# Patient Record
Sex: Female | Born: 1990 | Hispanic: Yes | Marital: Single | Smoking: Never smoker
Health system: Southern US, Community
[De-identification: ages and names within clinical notes are randomized; demographics above are authoritative.]

## PROBLEM LIST (undated history)

## (undated) ENCOUNTER — Inpatient Hospital Stay (HOSPITAL_COMMUNITY): Payer: Self-pay

## (undated) DIAGNOSIS — Q249 Congenital malformation of heart, unspecified: Secondary | ICD-10-CM

## (undated) DIAGNOSIS — K219 Gastro-esophageal reflux disease without esophagitis: Secondary | ICD-10-CM

## (undated) DIAGNOSIS — Z3490 Encounter for supervision of normal pregnancy, unspecified, unspecified trimester: Secondary | ICD-10-CM

## (undated) DIAGNOSIS — F329 Major depressive disorder, single episode, unspecified: Secondary | ICD-10-CM

## (undated) DIAGNOSIS — F32A Depression, unspecified: Secondary | ICD-10-CM

## (undated) HISTORY — DX: Congenital malformation of heart, unspecified: Q24.9

---

## 2014-06-25 LAB — OB RESULTS CONSOLE VARICELLA ZOSTER ANTIBODY, IGG: VARICELLA IGG: IMMUNE

## 2014-06-25 LAB — OB RESULTS CONSOLE RPR: RPR: NONREACTIVE

## 2014-06-25 LAB — OB RESULTS CONSOLE ABO/RH: RH TYPE: POSITIVE

## 2014-06-25 LAB — OB RESULTS CONSOLE HEPATITIS B SURFACE ANTIGEN: Hepatitis B Surface Ag: NEGATIVE

## 2014-06-25 LAB — OB RESULTS CONSOLE HGB/HCT, BLOOD: Hemoglobin: 12.5 g/dL

## 2014-06-25 LAB — OB RESULTS CONSOLE RUBELLA ANTIBODY, IGM: RUBELLA: IMMUNE

## 2014-06-25 LAB — OB RESULTS CONSOLE ANTIBODY SCREEN: ANTIBODY SCREEN: NEGATIVE

## 2014-06-25 LAB — SICKLE CELL SCREEN: Sickle Cell Screen: NORMAL

## 2014-06-25 LAB — CULTURE, OB URINE: Urine Culture, OB: NO GROWTH

## 2014-08-10 ENCOUNTER — Inpatient Hospital Stay (HOSPITAL_COMMUNITY)
Admission: AD | Admit: 2014-08-10 | Discharge: 2014-08-11 | Disposition: A | Discharge status: Home / Self Care | Payer: Medicaid Other | Source: Ambulatory Visit | Attending: Family Medicine | Admitting: Family Medicine

## 2014-08-10 ENCOUNTER — Encounter (HOSPITAL_COMMUNITY): Payer: Self-pay | Admitting: *Deleted

## 2014-08-10 DIAGNOSIS — O99612 Diseases of the digestive system complicating pregnancy, second trimester: Secondary | ICD-10-CM | POA: Diagnosis not present

## 2014-08-10 DIAGNOSIS — K219 Gastro-esophageal reflux disease without esophagitis: Secondary | ICD-10-CM | POA: Insufficient documentation

## 2014-08-10 DIAGNOSIS — Z3A17 17 weeks gestation of pregnancy: Secondary | ICD-10-CM | POA: Insufficient documentation

## 2014-08-10 DIAGNOSIS — R1010 Upper abdominal pain, unspecified: Secondary | ICD-10-CM | POA: Insufficient documentation

## 2014-08-10 DIAGNOSIS — IMO0001 Reserved for inherently not codable concepts without codable children: Secondary | ICD-10-CM

## 2014-08-10 LAB — CBC
HCT: 35.6 % — ABNORMAL LOW (ref 36.0–46.0)
Hemoglobin: 11.9 g/dL — ABNORMAL LOW (ref 12.0–15.0)
MCH: 29.8 pg (ref 26.0–34.0)
MCHC: 33.4 g/dL (ref 30.0–36.0)
MCV: 89.2 fL (ref 78.0–100.0)
Platelets: 307 10*3/uL (ref 150–400)
RBC: 3.99 MIL/uL (ref 3.87–5.11)
RDW: 13.1 % (ref 11.5–15.5)
WBC: 16.4 10*3/uL — AB (ref 4.0–10.5)

## 2014-08-10 MED ORDER — PROMETHAZINE HCL 25 MG PO TABS
25.0000 mg | ORAL_TABLET | Freq: Once | ORAL | Status: AC
  Administered 2014-08-10: 25 mg via ORAL
  Filled 2014-08-10: qty 1

## 2014-08-10 MED ORDER — FAMOTIDINE 20 MG PO TABS
20.0000 mg | ORAL_TABLET | Freq: Once | ORAL | Status: AC
  Administered 2014-08-10: 20 mg via ORAL
  Filled 2014-08-10: qty 1

## 2014-08-10 MED ORDER — GI COCKTAIL ~~LOC~~
30.0000 mL | Freq: Once | ORAL | Status: AC
  Administered 2014-08-10: 30 mL via ORAL
  Filled 2014-08-10: qty 30

## 2014-08-10 NOTE — MAU Note (Signed)
Pt reports upper abd pain that started 3 hours ago, states she vomited a lot when the pain first started. States she has had decreased appetite for the last 3 days. Also reports a headache all day today. Also reports mid upper back pain.

## 2014-08-10 NOTE — MAU Provider Note (Signed)
History     CSN: [id number]  Arrival date and time: 08/10/14 2148   First Provider Initiated Contact with Patient 08/10/14 2324      No chief complaint on file.  HPI Comments: Amanda Mahoney is a 24 y.o. [phone] at [redacted]w[redacted]d who presents today with upper abdominal pain. She states that the pain is worse after eating. She denies any vaginal bleeding or LOF. She states that she has felt the fetus move.   Abdominal Pain This is a new problem. The current episode started today. The onset quality is gradual. The problem occurs constantly. The problem has been unchanged. The pain is located in the epigastric region. The pain is at a severity of 7/10. The quality of the pain is burning. The abdominal pain does not radiate. Associated symptoms include nausea. Pertinent negatives include no constipation, diarrhea, dysuria, fever, frequency or vomiting. The pain is aggravated by eating. The pain is relieved by nothing. She has tried nothing for the symptoms.     History reviewed. No pertinent past medical history.  History reviewed. No pertinent past surgical history.  Family History  Problem Relation Age of Onset  . Hypertension Mother   . Hypertension Sister   . Hypertension Maternal Grandmother     History  Substance Use Topics  . Smoking status: Never Smoker   . Smokeless tobacco: Not on file  . Alcohol Use: No    Allergies:  Allergies  Allergen Reactions  . Latex Rash    No prescriptions prior to admission    Review of Systems  Constitutional: Negative for fever.  Gastrointestinal: Positive for nausea and abdominal pain. Negative for vomiting, diarrhea and constipation.  Genitourinary: Negative for dysuria, urgency and frequency.   Physical Exam   Blood pressure 118/62, pulse 114, temperature 98.8 F (37.1 C), resp. rate 18, height  (1.499 m), weight 106.595 kg (235 lb), SpO2 98 %.  Physical Exam  Nursing note and vitals reviewed. Constitutional: She is oriented  to person, place, and time. She appears well-developed and well-nourished. No distress.  HENT:  Head: Normocephalic.  Cardiovascular: Normal rate.   Respiratory: Effort normal.  GI: Soft. There is no tenderness. There is no rebound.  Neurological: She is alert and oriented to person, place, and time.  Skin: Skin is warm and dry.  Psychiatric: She has a normal mood and affect.   Results for orders placed or performed during the hospital encounter of 08/10/14 (from the past 24 hour(s))  CBC     Status: Abnormal   Collection Time: 08/10/14 11:31 PM  Result Value Ref Range   WBC 16.4 (H) 4.0 - 10.5 K/uL   RBC 3.99 3.87 - 5.11 MIL/uL   Hemoglobin 11.9 (L) 12.0 - 15.0 g/dL   HCT 64.4 (L) 03.4 - 74.2 %   MCV 89.2 78.0 - 100.0 fL   MCH 29.8 26.0 - 34.0 pg   MCHC 33.4 30.0 - 36.0 g/dL   RDW 59.5 63.8 - 75.6 %   Platelets 307 150 - 400 K/uL  Comprehensive metabolic panel     Status: Abnormal   Collection Time: 08/10/14 11:52 PM  Result Value Ref Range   Sodium 138 135 - 145 mmol/L   Potassium 3.6 3.5 - 5.1 mmol/L   Chloride 104 101 - 111 mmol/L   CO2 25 22 - 32 mmol/L   Glucose, Bld 99 65 - 99 mg/dL   BUN 7 6 - 20 mg/dL   Creatinine, Ser 4.33 0.44 - 1.00 mg/dL  Calcium 8.9 8.9 - 10.3 mg/dL   Total Protein 6.6 6.5 - 8.1 g/dL   Albumin 3.1 (L) 3.5 - 5.0 g/dL   AST 15 15 - 41 U/L   ALT 14 14 - 54 U/L   Alkaline Phosphatase 62 38 - 126 U/L   Total Bilirubin 0.1 (L) 0.3 - 1.2 mg/dL   GFR calc non Af Amer >60 >60 mL/min   GFR calc Af Amer >60 >60 mL/min   Anion gap 9 5 - 15  Amylase     Status: None   Collection Time: 08/10/14 11:52 PM  Result Value Ref Range   Amylase 80 28 - 100 U/L  Lipase, blood     Status: Abnormal   Collection Time: 08/10/14 11:52 PM  Result Value Ref Range   Lipase 16 (L) 22 - 51 U/L  Urinalysis, Routine w reflex microscopic (not at [facility])     Status: Abnormal   Collection Time: 08/11/14 12:08 AM  Result Value Ref Range   Color, Urine YELLOW YELLOW    APPearance CLEAR CLEAR   Specific Gravity, Urine >1.030 (H) 1.005 - 1.030   pH 5.5 5.0 - 8.0   Glucose, UA NEGATIVE NEGATIVE mg/dL   Hgb urine dipstick NEGATIVE NEGATIVE   Bilirubin Urine NEGATIVE NEGATIVE   Ketones, ur NEGATIVE NEGATIVE mg/dL   Protein, ur NEGATIVE NEGATIVE mg/dL   Urobilinogen, UA 0.2 0.0 - 1.0 mg/dL   Nitrite NEGATIVE NEGATIVE   Leukocytes, UA NEGATIVE NEGATIVE    MAU Course  Procedures  MDM Patient reports that pain has improved with pepcid and phenergan   Assessment and Plan   1. Reflux    DC home Comfort measures reviewed  2ndTrimester precautions  RX: protonix 20 mg BID #60  Return to MAU as needed FU with OB as planned  Follow-up Information    Follow up with [facility].   Specialty:  Obstetrics and Gynecology   Why:  they will call you with an appointment    Contact information:   [address] [place] [address] [phone]        Tawnya Crook 08/10/2014, 11:25 PM

## 2014-08-11 DIAGNOSIS — K219 Gastro-esophageal reflux disease without esophagitis: Secondary | ICD-10-CM | POA: Diagnosis not present

## 2014-08-11 LAB — COMPREHENSIVE METABOLIC PANEL
ALT: 14 U/L (ref 14–54)
ANION GAP: 9 (ref 5–15)
AST: 15 U/L (ref 15–41)
Albumin: 3.1 g/dL — ABNORMAL LOW (ref 3.5–5.0)
Alkaline Phosphatase: 62 U/L (ref 38–126)
BUN: 7 mg/dL (ref 6–20)
CHLORIDE: 104 mmol/L (ref 101–111)
CO2: 25 mmol/L (ref 22–32)
Calcium: 8.9 mg/dL (ref 8.9–10.3)
Creatinine, Ser: 0.51 mg/dL (ref 0.44–1.00)
GFR calc Af Amer: >60 mL/min (ref >60)
GFR calc non Af Amer: >60 mL/min (ref >60)
Glucose, Bld: 99 mg/dL (ref 65–99)
POTASSIUM: 3.6 mmol/L (ref 3.5–5.1)
Sodium: 138 mmol/L (ref 135–145)
TOTAL PROTEIN: 6.6 g/dL (ref 6.5–8.1)
Total Bilirubin: 0.1 mg/dL — ABNORMAL LOW (ref 0.3–1.2)

## 2014-08-11 LAB — URINALYSIS, ROUTINE W REFLEX MICROSCOPIC
BILIRUBIN URINE: NEGATIVE
Glucose, UA: NEGATIVE mg/dL
HGB URINE DIPSTICK: NEGATIVE
Ketones, ur: NEGATIVE mg/dL
LEUKOCYTES UA: NEGATIVE
Nitrite: NEGATIVE
Protein, ur: NEGATIVE mg/dL
Specific Gravity, Urine: >1.03 — ABNORMAL HIGH (ref 1.005–1.030)
Urobilinogen, UA: 0.2 mg/dL (ref 0.0–1.0)
pH: 5.5 (ref 5.0–8.0)

## 2014-08-11 LAB — LIPASE, BLOOD: LIPASE: 16 U/L — AB (ref 22–51)

## 2014-08-11 LAB — AMYLASE: Amylase: 80 U/L (ref 28–100)

## 2014-08-11 MED ORDER — ACETAMINOPHEN 500 MG PO TABS
1000.0000 mg | ORAL_TABLET | Freq: Once | ORAL | Status: AC
  Administered 2014-08-11: 1000 mg via ORAL
  Filled 2014-08-11: qty 2

## 2014-08-11 MED ORDER — PANTOPRAZOLE SODIUM 20 MG PO TBEC: 20.0000 mg | DELAYED_RELEASE_TABLET | Freq: Two times a day (BID) | ORAL | Status: DC

## 2014-08-11 NOTE — Discharge Instructions (Signed)

## 2014-08-12 ENCOUNTER — Ambulatory Visit (INDEPENDENT_AMBULATORY_CARE_PROVIDER_SITE_OTHER): Payer: Medicaid Other | Admitting: Advanced Practice Midwife

## 2014-08-12 ENCOUNTER — Encounter: Payer: Self-pay | Admitting: Advanced Practice Midwife

## 2014-08-12 VITALS — BP 114/59 | HR 101 | Temp 98.7°F | Wt 231.4 lb

## 2014-08-12 DIAGNOSIS — O0932 Supervision of pregnancy with insufficient antenatal care, second trimester: Secondary | ICD-10-CM | POA: Diagnosis not present

## 2014-08-12 LAB — POCT URINALYSIS DIP (DEVICE)
BILIRUBIN URINE: NEGATIVE
Glucose, UA: NEGATIVE mg/dL
HGB URINE DIPSTICK: NEGATIVE
KETONES UR: NEGATIVE mg/dL
Nitrite: NEGATIVE
PROTEIN: NEGATIVE mg/dL
SPECIFIC GRAVITY, URINE: 1.02 (ref 1.005–1.030)
Urobilinogen, UA: 1 mg/dL (ref 0.0–1.0)
pH: 6.5 (ref 5.0–8.0)

## 2014-08-12 NOTE — Patient Instructions (Signed)
Second Trimester of Pregnancy The second trimester is from week 13 through week 28, months 4 through 6. The second trimester is often a time when you feel your best. Your body has also adjusted to being pregnant, and you begin to feel better physically. Usually, morning sickness has lessened or quit completely, you may have more energy, and you may have an increase in appetite. The second trimester is also a time when the fetus is growing rapidly. At the end of the sixth month, the fetus is about 9 inches long and weighs about 1 pounds. You will likely begin to feel the baby move (quickening) between 18 and 20 weeks of the pregnancy. BODY CHANGES Your body goes through many changes during pregnancy. The changes vary from woman to woman.   Your weight will continue to increase. You will notice your lower abdomen bulging out.  You may begin to get stretch marks on your hips, abdomen, and breasts.  You may develop headaches that can be relieved by medicines approved by your health care provider.  You may urinate more often because the fetus is pressing on your bladder.  You may develop or continue to have heartburn as a result of your pregnancy.  You may develop constipation because certain hormones are causing the muscles that push waste through your intestines to slow down.  You may develop hemorrhoids or swollen, bulging veins (varicose veins).  You may have back pain because of the weight gain and pregnancy hormones relaxing your joints between the bones in your pelvis and as a result of a shift in weight and the muscles that support your balance.  Your breasts will continue to grow and be tender.  Your gums may bleed and may be sensitive to brushing and flossing.  Dark spots or blotches (chloasma, mask of pregnancy) may develop on your face. This will likely fade after the baby is born.  A dark line from your belly button to the pubic area (linea nigra) may appear. This will likely fade  after the baby is born.  You may have changes in your hair. These can include thickening of your hair, rapid growth, and changes in texture. Some women also have hair loss during or after pregnancy, or hair that feels dry or thin. Your hair will most likely return to normal after your baby is born. WHAT TO EXPECT AT YOUR PRENATAL VISITS During a routine prenatal visit:  You will be weighed to make sure you and the fetus are growing normally.  Your blood pressure will be taken.  Your abdomen will be measured to track your baby's growth.  The fetal heartbeat will be listened to.  Any test results from the previous visit will be discussed. Your health care provider may ask you:  How you are feeling.  If you are feeling the baby move.  If you have had any abnormal symptoms, such as leaking fluid, bleeding, severe headaches, or abdominal cramping.  If you have any questions. Other tests that may be performed during your second trimester include:  Blood tests that check for:  Low iron levels (anemia).  Gestational diabetes (between 24 and 28 weeks).  Rh antibodies.  Urine tests to check for infections, diabetes, or protein in the urine.  An ultrasound to confirm the proper growth and development of the baby.  An amniocentesis to check for possible genetic problems.  Fetal screens for spina bifida and Down syndrome. HOME CARE INSTRUCTIONS   Avoid all smoking, herbs, alcohol, and unprescribed   drugs. These chemicals affect the formation and growth of the baby.  Follow your health care provider's instructions regarding medicine use. There are medicines that are either safe or unsafe to take during pregnancy.  Exercise only as directed by your health care provider. Experiencing uterine cramps is a good sign to stop exercising.  Continue to eat regular, healthy meals.  Wear a good support bra for breast tenderness.  Do not use hot tubs, steam rooms, or saunas.  Wear your  seat belt at all times when driving.  Avoid raw meat, uncooked cheese, cat litter boxes, and soil used by cats. These carry germs that can cause birth defects in the baby.  Take your prenatal vitamins.  Try taking a stool softener (if your health care provider approves) if you develop constipation. Eat more high-fiber foods, such as fresh vegetables or fruit and whole grains. Drink plenty of fluids to keep your urine clear or pale yellow.  Take warm sitz baths to soothe any pain or discomfort caused by hemorrhoids. Use hemorrhoid cream if your health care provider approves.  If you develop varicose veins, wear support hose. Elevate your feet for 15 minutes, 3-4 times a day. Limit salt in your diet.  Avoid heavy lifting, wear low heel shoes, and practice good posture.  Rest with your legs elevated if you have leg cramps or low back pain.  Visit your dentist if you have not gone yet during your pregnancy. Use a soft toothbrush to brush your teeth and be gentle when you floss.  A sexual relationship may be continued unless your health care provider directs you otherwise.  Continue to go to all your prenatal visits as directed by your health care provider. SEEK MEDICAL CARE IF:   You have dizziness.  You have mild pelvic cramps, pelvic pressure, or nagging pain in the abdominal area.  You have persistent nausea, vomiting, or diarrhea.  You have a bad smelling vaginal discharge.  You have pain with urination. SEEK IMMEDIATE MEDICAL CARE IF:   You have a fever.  You are leaking fluid from your vagina.  You have spotting or bleeding from your vagina.  You have severe abdominal cramping or pain.  You have rapid weight gain or loss.  You have shortness of breath with chest pain.  You notice sudden or extreme swelling of your face, hands, ankles, feet, or legs.  You have not felt your baby move in over an hour.  You have severe headaches that do not go away with  medicine.  You have vision changes. Document Released: 12/13/2000 Document Revised: 12/24/2012 Document Reviewed: 02/20/2012 ExitCare Patient Information 2015 [employer]. This information is not intended to replace advice given to you by your health care provider. Make sure you discuss any questions you have with your health care provider.  

## 2014-08-12 NOTE — Progress Notes (Signed)
Here for first visit. States had one ob visit at [facility] Department.  Given prenatal education booklets today. C/o pain in upper abdomen at times, came to MAU 08/10/14 for that pain-states pain is worse after eating.

## 2014-08-12 NOTE — Progress Notes (Signed)
   Subjective:    Amanda Mahoney is a [id number] [redacted]w[redacted]d being seen today for her first obstetrical visit.  She was seen this week in the MAU for upper abdominal pain and treated with Protonix.    Her obstetrical history is significant for obesity. Patient does intend to breast feed. Pregnancy history fully reviewed.  Patient reports heartburn, nausea, vomiting and clear discharge from left nipple.  Filed Vitals:   08/12/14 0827  BP: 114/59  Pulse: 101  Temp: 98.7 F (37.1 C)  Weight: 231 lb 6.4 oz (104.962 kg)    HISTORY: OB History  Gravida Para Term Preterm AB SAB TAB Ectopic Multiple Living  0 2 2 0 0 0 2    # Outcome Date GA Lbr Len/2nd Weight Sex Delivery Anes PTL Lv  4 Current           3 SAB 12/2013 [redacted]w[redacted]d         2 SAB 01/2013 [redacted]w[redacted]d         1 Term 03/23/09 [redacted]w[redacted]d  6 lb (2.722 kg) M Vag-Spont None  Y     Comments: no complications, born in [place]     Past Medical History  Diagnosis Date  . 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 Family History  Problem Relation Age of Onset  . Hypertension Mother   . Hypertension Sister   . Hypertension Maternal Grandmother      Exam    Uterus:   not palpated  Pelvic Exam: Deferred  System: Breast:  normal appearance, no masses or tenderness, clear discharge seen on left nipple   Skin: normal coloration and turgor, no rashes    Neurologic: oriented, normal mood, no focal deficits   Extremities: normal strength, tone, and muscle mass   HEENT PERRLA, extra ocular movement intact, oropharynx clear, no lesions and neck supple with midline trachea   Mouth/Teeth mucous membranes moist, pharynx normal without lesions   Neck supple   Cardiovascular: regular rate and rhythm   Respiratory:  appears well, vitals normal, no respiratory distress, acyanotic, normal RR, ear and throat exam is normal, neck free of mass or lymphadenopathy, chest clear, no wheezing,  crepitations, rhonchi, normal symmetric air entry   Abdomen: soft, non-tender; bowel sounds normal; no masses,  no organomegaly   Urinary: bladder not palpable      Assessment:    Pregnancy: [name] There are no active problems to display for this patient.       Plan:     Initial labs drawn. Prenatal vitamins; pt is having difficulty tolerating the vitamins given to her, she will begin chewable Flinstones instead Problem list reviewed and updated. Genetic Screening discussed Quad Screen: ordered.  Ultrasound discussed; fetal survey: ordered.  Follow up in 4 weeks. 50% of 30 min visit spent on counseling and coordination of care.  Patient can use Zantac instead of Protonix for heartburn until she can get her medicaid  She is staying at the [facility] Records have been requested Amanda Mahoney 08/12/2014

## 2014-08-13 LAB — AFP, QUAD SCREEN
AFP: 18.2 ng/mL
Curr Gest Age: 17.4 wks.days
Down Syndrome Scr Risk Est: 1:3290 {titer}
HCG, Total: 14.79 IU/mL
INH: 93.4 pg/mL
INTERPRETATION-AFP: NEGATIVE
MOM FOR INH: 0.85
MoM for AFP: 0.62
MoM for hCG: 0.77
Open Spina bifida: NEGATIVE
Osb Risk: <1:27300 {titer}
Tri 18 Scr Risk Est: NEGATIVE
uE3 Mom: 1.32
uE3 Value: 1.53 ng/mL

## 2014-08-13 LAB — GLUCOSE TOLERANCE, 1 HOUR (50G) W/O FASTING: GLUCOSE 1 HOUR GTT: 108 mg/dL (ref 70–140)

## 2014-08-17 ENCOUNTER — Encounter (HOSPITAL_COMMUNITY): Payer: Self-pay | Admitting: *Deleted

## 2014-08-17 ENCOUNTER — Inpatient Hospital Stay (HOSPITAL_COMMUNITY): Payer: Medicaid Other

## 2014-08-17 ENCOUNTER — Inpatient Hospital Stay (HOSPITAL_COMMUNITY)
Admission: AD | Admit: 2014-08-17 | Discharge: 2014-08-17 | Disposition: A | Discharge status: Home / Self Care | Payer: Medicaid Other | Source: Ambulatory Visit | Attending: Obstetrics & Gynecology | Admitting: Obstetrics & Gynecology

## 2014-08-17 ENCOUNTER — Telehealth: Payer: Self-pay | Admitting: *Deleted

## 2014-08-17 DIAGNOSIS — Z3A18 18 weeks gestation of pregnancy: Secondary | ICD-10-CM | POA: Diagnosis not present

## 2014-08-17 DIAGNOSIS — O26612 Liver and biliary tract disorders in pregnancy, second trimester: Secondary | ICD-10-CM

## 2014-08-17 DIAGNOSIS — O99612 Diseases of the digestive system complicating pregnancy, second trimester: Secondary | ICD-10-CM | POA: Insufficient documentation

## 2014-08-17 DIAGNOSIS — R1010 Upper abdominal pain, unspecified: Secondary | ICD-10-CM | POA: Diagnosis present

## 2014-08-17 DIAGNOSIS — K8020 Calculus of gallbladder without cholecystitis without obstruction: Secondary | ICD-10-CM | POA: Diagnosis not present

## 2014-08-17 LAB — COMPREHENSIVE METABOLIC PANEL
ALBUMIN: 3.1 g/dL — AB (ref 3.5–5.0)
ALT: 19 U/L (ref 14–54)
ANION GAP: 8 (ref 5–15)
AST: 16 U/L (ref 15–41)
Alkaline Phosphatase: 65 U/L (ref 38–126)
BUN: 5 mg/dL — ABNORMAL LOW (ref 6–20)
CHLORIDE: 104 mmol/L (ref 101–111)
CO2: 25 mmol/L (ref 22–32)
CREATININE: 0.5 mg/dL (ref 0.44–1.00)
Calcium: 8.7 mg/dL — ABNORMAL LOW (ref 8.9–10.3)
GFR calc non Af Amer: >60 mL/min (ref >60)
Glucose, Bld: 94 mg/dL (ref 65–99)
Potassium: 4.1 mmol/L (ref 3.5–5.1)
SODIUM: 137 mmol/L (ref 135–145)
Total Bilirubin: 0.2 mg/dL — ABNORMAL LOW (ref 0.3–1.2)
Total Protein: 6.8 g/dL (ref 6.5–8.1)

## 2014-08-17 LAB — URINALYSIS, ROUTINE W REFLEX MICROSCOPIC
BILIRUBIN URINE: NEGATIVE
Glucose, UA: NEGATIVE mg/dL
Ketones, ur: NEGATIVE mg/dL
NITRITE: NEGATIVE
Protein, ur: NEGATIVE mg/dL
Urobilinogen, UA: 0.2 mg/dL (ref 0.0–1.0)
pH: 7 (ref 5.0–8.0)

## 2014-08-17 LAB — CBC
HCT: 36.7 % (ref 36.0–46.0)
HEMOGLOBIN: 12.1 g/dL (ref 12.0–15.0)
MCH: 29.7 pg (ref 26.0–34.0)
MCHC: 33 g/dL (ref 30.0–36.0)
MCV: 90 fL (ref 78.0–100.0)
PLATELETS: 312 10*3/uL (ref 150–400)
RBC: 4.08 MIL/uL (ref 3.87–5.11)
RDW: 13.1 % (ref 11.5–15.5)
WBC: 12.4 10*3/uL — ABNORMAL HIGH (ref 4.0–10.5)

## 2014-08-17 LAB — URINE MICROSCOPIC-ADD ON

## 2014-08-17 MED ORDER — GI COCKTAIL ~~LOC~~
30.0000 mL | Freq: Once | ORAL | Status: DC
Start: 2014-08-17 — End: 2014-08-17

## 2014-08-17 MED ORDER — ACETAMINOPHEN 500 MG PO TABS: 1000.0000 mg | ORAL_TABLET | Freq: Three times a day (TID) | ORAL | Status: DC | PRN

## 2014-08-17 MED ORDER — TRAMADOL HCL 50 MG PO TABS: 50.0000 mg | ORAL_TABLET | Freq: Four times a day (QID) | ORAL | Status: DC | PRN

## 2014-08-17 MED ORDER — FAMOTIDINE 20 MG PO TABS
40.0000 mg | ORAL_TABLET | Freq: Once | ORAL | Status: AC
  Administered 2014-08-17: 40 mg via ORAL
  Filled 2014-08-17: qty 2

## 2014-08-17 NOTE — Telephone Encounter (Signed)
Amanda Mahoney called Friday 08/14/14  Am and left a message in Spanish .  Called today with interpreter Nile Riggs and patient reports she does not need anything now because she has already gone to MAU and got it taken care of. She denies needing anything at this time.

## 2014-08-17 NOTE — MAU Note (Signed)
Pt presents to MAU with complaints of reflux. States she was evaluated a week ago and given medications for reflux and it isn't working. Reports abdominal pain since she ate supper last night. Denies any vaginal bleeding or abnormal discharge

## 2014-08-17 NOTE — MAU Provider Note (Signed)
History     CSN: [id number]  Arrival date and time: 08/17/14 0903   First Provider Initiated Contact with Patient 08/17/14 [phone]      Chief Complaint  Patient presents with  . Abdominal Pain   HPI   [name] is a 24 y.o. female [phone] at 22w2dpresenting with a 2 week history of upper abdominal pain. She presented to MAU on 8/8 with similar complaints and was diagnosed with GERD and was sent home with an RX for protonix in which she has been taking as prescribed. She had labs done that day and told they were all normal.  She feels like her stomach is full all the time; she cannot eat a full meal. The pain worsens after a meal and this is why she has been afraid to eat.  She tried eating a dinner roll last night and just felt too full to eat more.  She tried drinking apple juice this morning and vomited that up.  She has occasional heartburn, this discomfort is in the upper part of her abdomen; both sides, right below her breast.   She also has had problems with constipation. She is not using the bathroom on a regular basis, although her normal BM schedule is 2-3 times per day.   OB History    Gravida Para Term Preterm AB TAB SAB Ectopic Multiple Living   4 1 1  0 2 0 2 0 0 1      Past Medical History  Diagnosis Date  . 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 No pertinent past surgical history.  Family History  Problem Relation Age of Onset  . Hypertension Mother   . Hypertension Sister   . Hypertension Maternal Grandmother     Social History  Substance Use Topics  . Smoking status: Never Smoker   . Smokeless tobacco: Never Used  . Alcohol Use: No    Allergies:  Allergies  Allergen Reactions  . Latex Rash    Prescriptions prior to admission  Medication Sig Dispense Refill Last Dose  . pantoprazole (PROTONIX) 20 MG tablet Take 1 tablet (20 mg total) by mouth 2 (two) times daily. 60 tablet 1 08/16/2014 at Unknown time    Results for orders placed or performed during the hospital encounter of 08/17/14 (from the past 48 hour(s))  [facility]inal[facility], [facility]     Status: Abnormal   Collection Time: 08/17/14  9:00 AM  Result Value Ref Range   Color, Urine YELLOW YELLOW   APPearance HAZY (A) CLEAR   Specific Gravity, Urine <1.005 (L) 1.005 - 1.030   pH 7.0 5.0 - 8.0   Glucose, UA NEGATIVE NEGATIVE mg/dL   Hgb urine dipstick TRACE (A) NEGATIVE   Bilirubin Urine NEGATIVE NEGATIVE   Ketones, ur NEGATIVE NEGATIVE mg/dL   Protein, ur NEGATIVE NEGATIVE mg/dL   Urobilinogen, UA 0.2 0.0 - 1.0 mg/dL   Nitrite NEGATIVE NEGATIVE   Leukocytes, UA MODERATE (A) NEGATIVE  Urine microscopic-add on     Status: Abnormal   Collection Time: 08/17/14  9:00 AM  Result Value Ref Range   Squamous Epithelial / LPF MANY (A) RARE   WBC, UA 3-6 <3 WBC/hpf   Bacteria, UA FEW (A) RARE  CBC     Status: Abnormal   Collection Time: 08/17/14 11:42 AM  Result Value Ref Range   WBC 12.4 (H) 4.0 - 10.5 K/uL   RBC  4.08 3.87 - 5.11 MIL/uL   Hemoglobin 12.1 12.0 - 15.0 g/dL   HCT 36.7 36.0 - 46.0 %   MCV 90.0 78.0 - 100.0 fL   MCH 29.7 26.0 - 34.0 pg   MCHC 33.0 30.0 - 36.0 g/dL   RDW 13.1 11.5 - 15.5 %   Platelets 312 150 - 400 K/uL  Comprehensive metabolic panel     Status: Abnormal   Collection Time: 08/17/14 11:42 AM  Result Value Ref Range   Sodium 137 135 - 145 mmol/L   Potassium 4.1 3.5 - 5.1 mmol/L   Chloride 104 101 - 111 mmol/L   CO2 25 22 - 32 mmol/L   Glucose, Bld 94 65 - 99 mg/dL   BUN 5 (L) 6 - 20 mg/dL   Creatinine, Ser 0.50 0.44 - 1.00 mg/dL   Calcium 8.7 (L) 8.9 - 10.3 mg/dL   Total Protein 6.8 6.5 - 8.1 g/dL   Albumin 3.1 (L) 3.5 - 5.0 g/dL   AST 16 15 - 41 U/L   ALT 19 14 - 54 U/L   Alkaline Phosphatase 65 38 - 126 U/L   Total Bilirubin 0.2 (L) 0.3 - 1.2 mg/dL   GFR calc non Af Amer >60 >60 mL/min   GFR calc Af Amer >60 >60 mL/min    Comment: (NOTE) The eGFR has been  calculated using the CKD EPI equation. This calculation has not been validated in all clinical situations. eGFR's persistently <60 mL/min signify possible Chronic Kidney Disease.    Anion gap 8 5 - 15    US Abdomen Limited Ruq  08/17/2014   CLINICAL DATA:  Upper abdominal pain.  Second trimester gestation  EXAM: US ABDOMEN LIMITED - RIGHT UPPER QUADRANT  COMPARISON:  None.  FINDINGS: Gallbladder:  Within the gallbladder, there are echogenic foci which move and shadow consistent with gallstones. The largest gallstone measures 1.4 cm in length. There is no gallbladder wall thickening or pericholecystic fluid. No sonographic Murphy sign noted.  Common bile duct:  Diameter: 2 mm. There is no intrahepatic or extrahepatic biliary duct dilatation.  Liver:  No focal lesion identified. Within normal limits in parenchymal echogenicity.  IMPRESSION: Cholelithiasis.  Study otherwise unremarkable.   Electronically Signed   By: Lowella Grip III M.D.   On: 08/17/2014 12:20    Review of Systems  Constitutional: Negative for fever and chills.  Cardiovascular: Negative for chest pain.  Gastrointestinal: Positive for heartburn, nausea, vomiting and constipation.  Genitourinary: Negative for dysuria, urgency, frequency and flank pain.  Musculoskeletal: Negative for back pain.  Neurological: Positive for headaches.   Physical Exam   Blood pressure 123/69, pulse 98, temperature 98.2 F (36.8 C), resp. rate 18.  Physical Exam  Constitutional: She appears well-developed and well-nourished. No distress.  HENT:  Head: Normocephalic.  Eyes: Pupils are equal, round, and reactive to light.  GI: Normal appearance and bowel sounds are normal. She exhibits no distension. There is no splenomegaly or hepatomegaly. There is tenderness in the right upper quadrant, epigastric area, periumbilical area and left upper quadrant. There is rigidity and positive Murphy's sign. There is no rebound and no guarding.  Skin: She  is not diaphoretic.    MAU Course  Procedures  None  MDM  + fetal heart tones.  Pepcid 40 mg PO Discussed [place] with Dr. Harolyn Rutherford  No relief after Pepcid, patient sitting in the bed stating that she feels like something is pulling and tightening in her upper abdomen;both sides, just below her  breasts.   Upper abdominal US ordered.   Assessment and Plan   A:  1. Cholelithiasis affecting pregnancy in second trimester, antepartum   2. Upper abdominal pain    P:  Discharge home in stable condition Return to MAU if symptoms worsen  RX: Ultram Follow up with [place] [facility] on Friday at 1:40 pm for appointment; referral made.  Diet discussed, avoid high fat, fried foods.     Lezlie Lye, NP 08/17/2014 3:00 PM

## 2014-08-21 ENCOUNTER — Encounter: Payer: Self-pay | Admitting: Advanced Practice Midwife

## 2014-08-23 LAB — BARBITURATES (GC/LC/MS), URINE
Amobarbital: NEGATIVE ng/mL (ref <100)
Butalbital: NEGATIVE ng/mL (ref <100)
PENTABARBITAL GC/MS, URINE: NEGATIVE ng/mL (ref <100)
Phenobarbital: 266 ng/mL — AB (ref <100)
Secobarbital: NEGATIVE ng/mL (ref <100)

## 2014-08-24 LAB — PRESCRIPTION MONITORING PROFILE (19 PANEL)
AMPHETAMINE/METH: NEGATIVE ng/mL
Benzodiazepine Screen, Urine: NEGATIVE ng/mL
Buprenorphine, Urine: NEGATIVE ng/mL
CANNABINOID SCRN UR: NEGATIVE ng/mL
CARISOPRODOL, URINE: NEGATIVE ng/mL
Cocaine Metabolites: NEGATIVE ng/mL
Creatinine, Urine: 126.42 mg/dL (ref >20.0)
Fentanyl, Ur: NEGATIVE ng/mL
MDMA URINE: NEGATIVE ng/mL
METHADONE SCREEN, URINE: NEGATIVE ng/mL
Meperidine, Ur: NEGATIVE ng/mL
Methaqualone: NEGATIVE ng/mL
Nitrites, Initial: NEGATIVE ug/mL
OPIATE SCREEN, URINE: NEGATIVE ng/mL
OXYCODONE SCRN UR: NEGATIVE ng/mL
PHENCYCLIDINE, UR: NEGATIVE ng/mL
PROPOXYPHENE: NEGATIVE ng/mL
TAPENTADOLUR: NEGATIVE ng/mL
Tramadol Scrn, Ur: NEGATIVE ng/mL
Zolpidem, Urine: NEGATIVE ng/mL
pH, Initial: 7.1 pH (ref 4.5–8.9)

## 2014-08-26 ENCOUNTER — Ambulatory Visit (HOSPITAL_COMMUNITY)
Admission: RE | Admit: 2014-08-26 | Discharge: 2014-08-26 | Disposition: A | Discharge status: Home / Self Care | Payer: Medicaid Other | Source: Ambulatory Visit | Attending: Advanced Practice Midwife | Admitting: Advanced Practice Midwife

## 2014-08-26 ENCOUNTER — Other Ambulatory Visit: Payer: Self-pay | Admitting: Advanced Practice Midwife

## 2014-08-26 DIAGNOSIS — O0932 Supervision of pregnancy with insufficient antenatal care, second trimester: Secondary | ICD-10-CM

## 2014-08-26 DIAGNOSIS — O99212 Obesity complicating pregnancy, second trimester: Secondary | ICD-10-CM

## 2014-08-26 DIAGNOSIS — Z3689 Encounter for other specified antenatal screening: Secondary | ICD-10-CM

## 2014-08-26 DIAGNOSIS — K8020 Calculus of gallbladder without cholecystitis without obstruction: Secondary | ICD-10-CM

## 2014-08-26 DIAGNOSIS — O26619 Liver and biliary tract disorders in pregnancy, unspecified trimester: Secondary | ICD-10-CM

## 2014-08-26 DIAGNOSIS — Z36 Encounter for antenatal screening of mother: Secondary | ICD-10-CM | POA: Insufficient documentation

## 2014-08-26 DIAGNOSIS — Z3A19 19 weeks gestation of pregnancy: Secondary | ICD-10-CM

## 2014-08-26 DIAGNOSIS — O26612 Liver and biliary tract disorders in pregnancy, second trimester: Secondary | ICD-10-CM | POA: Insufficient documentation

## 2014-08-26 DIAGNOSIS — E669 Obesity, unspecified: Secondary | ICD-10-CM | POA: Insufficient documentation

## 2014-08-27 ENCOUNTER — Ambulatory Visit: Payer: Self-pay | Admitting: General Surgery

## 2014-08-31 ENCOUNTER — Encounter: Payer: Self-pay | Admitting: Advanced Practice Midwife

## 2014-08-31 DIAGNOSIS — F1390 Sedative, hypnotic, or anxiolytic use, unspecified, uncomplicated: Secondary | ICD-10-CM | POA: Insufficient documentation

## 2014-08-31 DIAGNOSIS — F1310 Sedative, hypnotic or anxiolytic abuse, uncomplicated: Secondary | ICD-10-CM

## 2014-08-31 NOTE — Addendum Note (Signed)
Encounter addended by: Aviva Signs, CNM on: 08/31/2014  6:43 PM<BR>     Documentation filed: Problem List

## 2014-09-09 ENCOUNTER — Ambulatory Visit (INDEPENDENT_AMBULATORY_CARE_PROVIDER_SITE_OTHER): Payer: Medicaid Other | Admitting: Obstetrics and Gynecology

## 2014-09-09 VITALS — BP 94/65 | HR 101 | Temp 98.3°F | Wt 233.7 lb

## 2014-09-09 DIAGNOSIS — Z23 Encounter for immunization: Secondary | ICD-10-CM | POA: Diagnosis not present

## 2014-09-09 DIAGNOSIS — O0932 Supervision of pregnancy with insufficient antenatal care, second trimester: Secondary | ICD-10-CM

## 2014-09-09 DIAGNOSIS — K811 Chronic cholecystitis: Secondary | ICD-10-CM | POA: Insufficient documentation

## 2014-09-09 LAB — POCT URINALYSIS DIP (DEVICE)
Bilirubin Urine: NEGATIVE
GLUCOSE, UA: NEGATIVE mg/dL
Hgb urine dipstick: NEGATIVE
KETONES UR: NEGATIVE mg/dL
NITRITE: NEGATIVE
PROTEIN: NEGATIVE mg/dL
Specific Gravity, Urine: 1.015 (ref 1.005–1.030)
UROBILINOGEN UA: 1 mg/dL (ref 0.0–1.0)
pH: 7 (ref 5.0–8.0)

## 2014-09-09 MED ORDER — TETANUS-DIPHTH-ACELL PERTUSSIS 5-2.5-18.5 LF-MCG/0.5 IM SUSP: 0.5000 mL | Freq: Once | INTRAMUSCULAR | Status: DC

## 2014-09-09 NOTE — Progress Notes (Signed)
Edema- feet/ legs    Pain/pressure- lower abd and pressure when gets out of bed and standing  Educated pt on Feeding on Demand and info given  Medical Instruction Form filled out and given to pt

## 2014-09-09 NOTE — Addendum Note (Signed)
Addended by: Faythe Casa on: 09/09/2014 09:36 AM   Modules accepted: Orders

## 2014-09-09 NOTE — Progress Notes (Signed)
Subjective:  Azaleah Usman is a 24 y.o. [phone] at [redacted]w[redacted]d being seen today for ongoing prenatal care.  Patient reports no complaints.  Contractions: Not present.  Vag. Bleeding: None. Movement: Present. Denies leaking of fluid.   The following portions of the patient's history were reviewed and updated as appropriate: allergies, current medications, past family history, past medical history, past social history, past surgical history and problem list.   Objective:   Filed Vitals:   09/09/14 0853  BP: 94/65  Pulse: 101  Temp: 98.3 F (36.8 C)  Weight: 233 lb 11.2 oz (106.006 kg)    Fetal Status: Fetal Heart Rate (bpm): 142   Movement: Present     General:  Alert, oriented and cooperative. Patient is in no acute distress.  Skin: Skin is warm and dry. No rash noted.   Cardiovascular: Normal heart rate noted  Respiratory: Normal respiratory effort, no problems with respiration noted  Abdomen: Soft, gravid, appropriate for gestational age. Pain/Pressure: Present     Pelvic: Vag. Bleeding: None     Cervical exam deferred        Extremities: Normal range of motion.  Edema: Trace  Mental Status: Normal mood and affect. Normal behavior. Normal judgment and thought content.   Urinalysis: Urine Protein: Negative Urine Glucose: Negative  Assessment and Plan:  Pregnancy: [id number] at [redacted]w[redacted]d  1. Late prenatal care affecting pregnancy in second trimester, antepartum - scheduling f/u growth scan today - flu vaccine today  Preterm labor symptoms and general obstetric precautions including but not limited to vaginal bleeding, contractions, leaking of fluid and fetal movement were reviewed in detail with the patient. Please refer to After Visit Summary for other counseling recommendations.  Return in about 4 weeks (around 10/07/2014).   Kathrynn Running, MD

## 2014-09-11 NOTE — Patient Instructions (Addendum)
YOUR PROCEDURE IS SCHEDULED ON :  09/15/14  REPORT TO [facility] MAIN ENTRANCE FOLLOW SIGNS TO EAST ELEVATOR - GO TO 3rd FLOOR CHECK IN AT 3 EAST NURSES STATION (SHORT STAY) AT:  10:15 AM  CALL THIS NUMBER IF YOU HAVE PROBLEMS THE MORNING OF SURGERY [phone]  REMEMBER:ONLY 1 PER PERSON MAY GO TO SHORT STAY WITH YOU TO GET READY THE MORNING OF YOUR SURGERY  DO NOT EAT FOOD OR DRINK LIQUIDS AFTER MIDNIGHT  TAKE THESE MEDICINES THE MORNING OF SURGERY: PROTONIX / TRAMADOL  YOU MAY NOT HAVE ANY METAL ON YOUR BODY INCLUDING HAIR PINS AND PIERCING'S. DO NOT WEAR JEWELRY, MAKEUP, LOTIONS, POWDERS OR PERFUMES. DO NOT WEAR NAIL POLISH. DO NOT SHAVE 48 HRS PRIOR TO SURGERY. MEN MAY SHAVE FACE AND NECK.  DO NOT BRING VALUABLES TO HOSPITAL. [facility] IS NOT RESPONSIBLE FOR VALUABLES.  CONTACTS, DENTURES OR PARTIALS MAY NOT BE WORN TO SURGERY. LEAVE SUITCASE IN CAR. CAN BE BROUGHT TO ROOM AFTER SURGERY.  PATIENTS DISCHARGED THE DAY OF SURGERY WILL NOT BE ALLOWED TO DRIVE HOME.  PLEASE READ OVER THE FOLLOWING INSTRUCTION SHEETS _________________________________________________________________________________                                          [facility] - PREPARING FOR SURGERY  Before surgery, you can play an important role.  Because skin is not sterile, your skin needs to be as free of germs as possible.  You can reduce the number of germs on your skin by washing with CHG (chlorahexidine gluconate) soap before surgery.  CHG is an antiseptic cleaner which kills germs and bonds with the skin to continue killing germs even after washing. Please DO NOT use if you have an allergy to CHG or antibacterial soaps.  If your skin becomes reddened/irritated stop using the CHG and inform your nurse when you arrive at Short Stay. Do not shave (including legs and underarms) for at least 48 hours prior to the first CHG shower.  You may shave your face. Please follow these  instructions carefully:   1.  Shower with CHG Soap the night before surgery and the  morning of Surgery.   2.  If you choose to wash your hair, wash your hair first as usual with your  normal  Shampoo.   3.  After you shampoo, rinse your hair and body thoroughly to remove the  shampoo.                                         4.  Use CHG as you would any other liquid soap.  You can apply chg directly  to the skin and wash . Gently wash with scrungie or clean wascloth    5.  Apply the CHG Soap to your body ONLY FROM THE NECK DOWN.   Do not use on open                           Wound or open sores. Avoid contact with eyes, ears mouth and genitals (private parts).                        Genitals (private parts) with your normal soap.  6.  Wash thoroughly, paying special attention to the area where your surgery  will be performed.   7.  Thoroughly rinse your body with warm water from the neck down.   8.  DO NOT shower/wash with your normal soap after using and rinsing off  the CHG Soap .                9.  Pat yourself dry with a clean towel.             10.  Wear clean night clothes to bed after shower             11.  Place clean sheets on your bed the night of your first shower and do not  sleep with pets.  Day of Surgery : Do not apply any lotions/deodorants the morning of surgery.  Please wear clean clothes to the hospital/surgery center.  FAILURE TO FOLLOW THESE INSTRUCTIONS MAY RESULT IN THE CANCELLATION OF YOUR SURGERY    PATIENT SIGNATURE_________________________________  ______________________________________________________________________

## 2014-09-14 ENCOUNTER — Encounter (HOSPITAL_COMMUNITY)
Admission: RE | Admit: 2014-09-14 | Discharge: 2014-09-14 | Disposition: A | Discharge status: Home / Self Care | Payer: Medicaid Other | Source: Ambulatory Visit | Attending: General Surgery | Admitting: General Surgery

## 2014-09-14 ENCOUNTER — Encounter (HOSPITAL_COMMUNITY): Payer: Self-pay

## 2014-09-14 DIAGNOSIS — Z01818 Encounter for other preprocedural examination: Secondary | ICD-10-CM | POA: Diagnosis present

## 2014-09-14 DIAGNOSIS — K819 Cholecystitis, unspecified: Secondary | ICD-10-CM | POA: Insufficient documentation

## 2014-09-14 HISTORY — DX: Depression, unspecified: F32.A

## 2014-09-14 HISTORY — DX: Encounter for supervision of normal pregnancy, unspecified, unspecified trimester: Z34.90

## 2014-09-14 HISTORY — DX: Major depressive disorder, single episode, unspecified: F32.9

## 2014-09-14 HISTORY — DX: Gastro-esophageal reflux disease without esophagitis: K21.9

## 2014-09-14 LAB — CBC
HEMATOCRIT: 35.9 % — AB (ref 36.0–46.0)
HEMOGLOBIN: 11.7 g/dL — AB (ref 12.0–15.0)
MCH: 28.9 pg (ref 26.0–34.0)
MCHC: 32.6 g/dL (ref 30.0–36.0)
MCV: 88.6 fL (ref 78.0–100.0)
PLATELETS: 331 10*3/uL (ref 150–400)
RBC: 4.05 MIL/uL (ref 3.87–5.11)
RDW: 12.9 % (ref 11.5–15.5)
WBC: 12.1 10*3/uL — ABNORMAL HIGH (ref 4.0–10.5)

## 2014-09-14 NOTE — Progress Notes (Signed)
Interpreter did not show for preop visit. Pt understands verbal English but has difficulty reading [place]. Hx obtained and verbal instructions given pt verbalized understanding. Consent to be signed on day of surgery with interpreter to translate written surgical consent. Interpreting Services notified.

## 2014-09-15 ENCOUNTER — Encounter (HOSPITAL_COMMUNITY): Payer: Self-pay | Admitting: *Deleted

## 2014-09-15 ENCOUNTER — Ambulatory Visit (HOSPITAL_COMMUNITY): Payer: Medicaid Other | Admitting: Certified Registered Nurse Anesthetist

## 2014-09-15 ENCOUNTER — Observation Stay (HOSPITAL_COMMUNITY)
Admission: RE | Admit: 2014-09-15 | Discharge: 2014-09-17 | Disposition: A | Discharge status: Home / Self Care | Payer: Medicaid Other | Source: Ambulatory Visit | Attending: General Surgery | Admitting: General Surgery

## 2014-09-15 ENCOUNTER — Encounter (HOSPITAL_COMMUNITY)
Admission: RE | Disposition: A | Discharge status: Home / Self Care | Payer: Self-pay | Source: Ambulatory Visit | Attending: General Surgery

## 2014-09-15 DIAGNOSIS — O26892 Other specified pregnancy related conditions, second trimester: Secondary | ICD-10-CM | POA: Insufficient documentation

## 2014-09-15 DIAGNOSIS — O99212 Obesity complicating pregnancy, second trimester: Secondary | ICD-10-CM | POA: Insufficient documentation

## 2014-09-15 DIAGNOSIS — O99342 Other mental disorders complicating pregnancy, second trimester: Secondary | ICD-10-CM | POA: Insufficient documentation

## 2014-09-15 DIAGNOSIS — Z3A22 22 weeks gestation of pregnancy: Secondary | ICD-10-CM | POA: Insufficient documentation

## 2014-09-15 DIAGNOSIS — Z91040 Latex allergy status: Secondary | ICD-10-CM | POA: Insufficient documentation

## 2014-09-15 DIAGNOSIS — Z6841 Body Mass Index (BMI) 40.0 and over, adult: Secondary | ICD-10-CM | POA: Insufficient documentation

## 2014-09-15 DIAGNOSIS — K219 Gastro-esophageal reflux disease without esophagitis: Secondary | ICD-10-CM | POA: Diagnosis not present

## 2014-09-15 DIAGNOSIS — R1011 Right upper quadrant pain: Secondary | ICD-10-CM | POA: Diagnosis present

## 2014-09-15 DIAGNOSIS — F329 Major depressive disorder, single episode, unspecified: Secondary | ICD-10-CM | POA: Insufficient documentation

## 2014-09-15 DIAGNOSIS — O99612 Diseases of the digestive system complicating pregnancy, second trimester: Secondary | ICD-10-CM | POA: Diagnosis not present

## 2014-09-15 DIAGNOSIS — K8012 Calculus of gallbladder with acute and chronic cholecystitis without obstruction: Secondary | ICD-10-CM | POA: Insufficient documentation

## 2014-09-15 DIAGNOSIS — K819 Cholecystitis, unspecified: Secondary | ICD-10-CM | POA: Diagnosis present

## 2014-09-15 HISTORY — PX: CHOLECYSTECTOMY: SHX55

## 2014-09-15 LAB — GLUCOSE, CAPILLARY: Glucose-Capillary: 144 mg/dL — ABNORMAL HIGH (ref 65–99)

## 2014-09-15 SURGERY — LAPAROSCOPIC CHOLECYSTECTOMY WITH INTRAOPERATIVE CHOLANGIOGRAM
Anesthesia: General | Site: Abdomen

## 2014-09-15 MED ORDER — MORPHINE SULFATE (PF) 2 MG/ML IV SOLN
2.0000 mg | INTRAVENOUS | Status: DC | PRN
  Administered 2014-09-15 – 2014-09-16 (×4): 2 mg via INTRAVENOUS
  Filled 2014-09-15 (×5): qty 1

## 2014-09-15 MED ORDER — ENOXAPARIN SODIUM 40 MG/0.4ML ~~LOC~~ SOLN
40.0000 mg | Freq: Once | SUBCUTANEOUS | Status: AC
  Administered 2014-09-15: 40 mg via SUBCUTANEOUS
  Filled 2014-09-15: qty 0.4

## 2014-09-15 MED ORDER — CEFAZOLIN SODIUM-DEXTROSE 2-3 GM-% IV SOLR
2.0000 g | INTRAVENOUS | Status: AC
  Administered 2014-09-15: 2 g via INTRAVENOUS

## 2014-09-15 MED ORDER — MIDAZOLAM HCL 2 MG/2ML IJ SOLN
INTRAMUSCULAR | Status: AC
  Filled 2014-09-15: qty 2

## 2014-09-15 MED ORDER — ONDANSETRON HCL 4 MG/2ML IJ SOLN
4.0000 mg | Freq: Four times a day (QID) | INTRAMUSCULAR | Status: DC | PRN
  Administered 2014-09-16: 4 mg via INTRAVENOUS
  Filled 2014-09-15: qty 2

## 2014-09-15 MED ORDER — HYDROMORPHONE HCL 1 MG/ML IJ SOLN
INTRAMUSCULAR | Status: AC
  Filled 2014-09-15: qty 1

## 2014-09-15 MED ORDER — FENTANYL CITRATE (PF) 100 MCG/2ML IJ SOLN
INTRAMUSCULAR | Status: DC | PRN
  Administered 2014-09-15: 25 ug via INTRAVENOUS
  Administered 2014-09-15: 50 ug via INTRAVENOUS
  Administered 2014-09-15: 25 ug via INTRAVENOUS
  Administered 2014-09-15: 100 ug via INTRAVENOUS
  Administered 2014-09-15: 50 ug via INTRAVENOUS

## 2014-09-15 MED ORDER — SUCCINYLCHOLINE CHLORIDE 20 MG/ML IJ SOLN
INTRAMUSCULAR | Status: DC | PRN
  Administered 2014-09-15: 100 mg via INTRAVENOUS

## 2014-09-15 MED ORDER — PROPOFOL 10 MG/ML IV BOLUS
INTRAVENOUS | Status: DC | PRN
  Administered 2014-09-15: 150 mg via INTRAVENOUS

## 2014-09-15 MED ORDER — MIDAZOLAM HCL 2 MG/2ML IJ SOLN
INTRAMUSCULAR | Status: AC
  Filled 2014-09-15: qty 4

## 2014-09-15 MED ORDER — 0.9 % SODIUM CHLORIDE (POUR BTL) OPTIME
TOPICAL | Status: DC | PRN
  Administered 2014-09-15: 1000 mL

## 2014-09-15 MED ORDER — EPHEDRINE SULFATE 50 MG/ML IJ SOLN
INTRAMUSCULAR | Status: AC
  Filled 2014-09-15: qty 1

## 2014-09-15 MED ORDER — LACTATED RINGERS IV SOLN
INTRAVENOUS | Status: DC
  Administered 2014-09-15 (×2): via INTRAVENOUS
  Administered 2014-09-15: 1000 mL via INTRAVENOUS

## 2014-09-15 MED ORDER — LIDOCAINE HCL (CARDIAC) 20 MG/ML IV SOLN
INTRAVENOUS | Status: AC
  Filled 2014-09-15: qty 5

## 2014-09-15 MED ORDER — HYDROMORPHONE HCL 1 MG/ML IJ SOLN
0.2500 mg | INTRAMUSCULAR | Status: DC | PRN
  Administered 2014-09-15 (×2): 0.5 mg via INTRAVENOUS

## 2014-09-15 MED ORDER — PHENYLEPHRINE 40 MCG/ML (10ML) SYRINGE FOR IV PUSH (FOR BLOOD PRESSURE SUPPORT)
PREFILLED_SYRINGE | INTRAVENOUS | Status: AC
  Filled 2014-09-15: qty 20

## 2014-09-15 MED ORDER — ROCURONIUM BROMIDE 100 MG/10ML IV SOLN
INTRAVENOUS | Status: DC | PRN
  Administered 2014-09-15 (×2): 20 mg via INTRAVENOUS

## 2014-09-15 MED ORDER — CEFAZOLIN SODIUM-DEXTROSE 2-3 GM-% IV SOLR
INTRAVENOUS | Status: AC
  Filled 2014-09-15: qty 50

## 2014-09-15 MED ORDER — PROPOFOL 10 MG/ML IV BOLUS
INTRAVENOUS | Status: AC
  Filled 2014-09-15: qty 20

## 2014-09-15 MED ORDER — HYDROCODONE-ACETAMINOPHEN 5-325 MG PO TABS
1.0000 | ORAL_TABLET | ORAL | Status: DC | PRN
  Administered 2014-09-16 (×2): 2 via ORAL
  Filled 2014-09-15 (×2): qty 2

## 2014-09-15 MED ORDER — GLYCOPYRROLATE 0.2 MG/ML IJ SOLN
INTRAMUSCULAR | Status: AC
  Filled 2014-09-15: qty 3

## 2014-09-15 MED ORDER — LIDOCAINE HCL (CARDIAC) 20 MG/ML IV SOLN
INTRAVENOUS | Status: DC | PRN
  Administered 2014-09-15: 50 mg via INTRAVENOUS

## 2014-09-15 MED ORDER — ONDANSETRON HCL 4 MG/2ML IJ SOLN
INTRAMUSCULAR | Status: DC | PRN
  Administered 2014-09-15: 4 mg via INTRAVENOUS

## 2014-09-15 MED ORDER — NEOSTIGMINE METHYLSULFATE 10 MG/10ML IV SOLN
INTRAVENOUS | Status: DC | PRN
  Administered 2014-09-15: 4 mg via INTRAVENOUS

## 2014-09-15 MED ORDER — PANTOPRAZOLE SODIUM 20 MG PO TBEC
20.0000 mg | DELAYED_RELEASE_TABLET | Freq: Two times a day (BID) | ORAL | Status: DC
  Administered 2014-09-16 – 2014-09-17 (×4): 20 mg via ORAL
  Filled 2014-09-15 (×5): qty 1

## 2014-09-15 MED ORDER — BUPIVACAINE-EPINEPHRINE 0.25% -1:200000 IJ SOLN
INTRAMUSCULAR | Status: DC | PRN
  Administered 2014-09-15: 30 mL

## 2014-09-15 MED ORDER — BUPIVACAINE-EPINEPHRINE (PF) 0.25% -1:200000 IJ SOLN
INTRAMUSCULAR | Status: AC
  Filled 2014-09-15: qty 30

## 2014-09-15 MED ORDER — DEXTROSE-NACL 5-0.45 % IV SOLN
INTRAVENOUS | Status: DC
  Administered 2014-09-16: 01:00:00 via INTRAVENOUS
  Administered 2014-09-16: 100 mL/h via INTRAVENOUS
  Administered 2014-09-16: 11:00:00 via INTRAVENOUS

## 2014-09-15 MED ORDER — NEOSTIGMINE METHYLSULFATE 10 MG/10ML IV SOLN
INTRAVENOUS | Status: AC
  Filled 2014-09-15: qty 1

## 2014-09-15 MED ORDER — PROMETHAZINE HCL 25 MG/ML IJ SOLN: 6.2500 mg | INTRAMUSCULAR | Status: DC | PRN

## 2014-09-15 MED ORDER — ROCURONIUM BROMIDE 100 MG/10ML IV SOLN
INTRAVENOUS | Status: AC
  Filled 2014-09-15: qty 1

## 2014-09-15 MED ORDER — FENTANYL CITRATE (PF) 250 MCG/5ML IJ SOLN
INTRAMUSCULAR | Status: AC
  Filled 2014-09-15: qty 5

## 2014-09-15 MED ORDER — ONDANSETRON HCL 4 MG/2ML IJ SOLN
INTRAMUSCULAR | Status: AC
  Filled 2014-09-15: qty 2

## 2014-09-15 MED ORDER — LACTATED RINGERS IR SOLN
Status: DC | PRN
  Administered 2014-09-15: 1000 mL

## 2014-09-15 MED ORDER — TRAMADOL HCL 50 MG PO TABS
50.0000 mg | ORAL_TABLET | Freq: Four times a day (QID) | ORAL | Status: DC | PRN
  Administered 2014-09-15 – 2014-09-16 (×2): 50 mg via ORAL
  Filled 2014-09-15 (×2): qty 1

## 2014-09-15 MED ORDER — PHENYLEPHRINE HCL 10 MG/ML IJ SOLN
INTRAMUSCULAR | Status: DC | PRN
  Administered 2014-09-15: 40 ug via INTRAVENOUS
  Administered 2014-09-15 (×7): 80 ug via INTRAVENOUS

## 2014-09-15 MED ORDER — GLYCOPYRROLATE 0.2 MG/ML IJ SOLN
INTRAMUSCULAR | Status: DC | PRN
  Administered 2014-09-15: .6 mg via INTRAVENOUS

## 2014-09-15 MED ORDER — ONDANSETRON 4 MG PO TBDP: 4.0000 mg | ORAL_TABLET | Freq: Four times a day (QID) | ORAL | Status: DC | PRN

## 2014-09-15 MED ORDER — FENTANYL CITRATE (PF) 250 MCG/5ML IJ SOLN
INTRAMUSCULAR | Status: AC
  Filled 2014-09-15: qty 25

## 2014-09-15 MED ORDER — CEFAZOLIN SODIUM-DEXTROSE 2-3 GM-% IV SOLR
2.0000 g | Freq: Three times a day (TID) | INTRAVENOUS | Status: AC
  Administered 2014-09-15: 2 g via INTRAVENOUS
  Filled 2014-09-15: qty 50

## 2014-09-15 MED ORDER — EPHEDRINE SULFATE 50 MG/ML IJ SOLN
INTRAMUSCULAR | Status: DC | PRN
  Administered 2014-09-15: 10 mg via INTRAVENOUS
  Administered 2014-09-15 (×2): 5 mg via INTRAVENOUS

## 2014-09-15 SURGICAL SUPPLY — 42 items
APPLIER CLIP 5 13 M/L LIGAMAX5 (MISCELLANEOUS)
APPLIER CLIP ROT 10 11.4 M/L (STAPLE) ×3
CABLE HIGH FREQUENCY MONO STRZ (ELECTRODE) ×3 IMPLANT
CATH CHOLANG 76X19 KUMAR (CATHETERS) IMPLANT
CHLORAPREP W/TINT 26ML (MISCELLANEOUS) ×3 IMPLANT
CLIP APPLIE 5 13 M/L LIGAMAX5 (MISCELLANEOUS) IMPLANT
CLIP APPLIE ROT 10 11.4 M/L (STAPLE) ×1 IMPLANT
COVER MAYO STAND STRL (DRAPES) ×3 IMPLANT
COVER SURGICAL LIGHT HANDLE (MISCELLANEOUS) IMPLANT
CUTTER FLEX LINEAR 45M (STAPLE) IMPLANT
DECANTER SPIKE VIAL GLASS SM (MISCELLANEOUS) ×3 IMPLANT
DRAIN CHANNEL 19F RND (DRAIN) IMPLANT
DRAPE C-ARM 42X120 X-RAY (DRAPES) ×3 IMPLANT
DRAPE LAPAROSCOPIC ABDOMINAL (DRAPES) ×3 IMPLANT
ELECT REM PT RETURN 9FT ADLT (ELECTROSURGICAL) ×3
ELECTRODE REM PT RTRN 9FT ADLT (ELECTROSURGICAL) ×1 IMPLANT
EVACUATOR SILICONE 100CC (DRAIN) IMPLANT
GLOVE BIOGEL PI IND STRL 7.0 (GLOVE) ×3 IMPLANT
GLOVE BIOGEL PI INDICATOR 7.0 (GLOVE) ×6
GLOVE SURG SS PI 7.0 STRL IVOR (GLOVE) ×3 IMPLANT
GOWN STRL REUS W/TWL LRG LVL3 (GOWN DISPOSABLE) ×3 IMPLANT
GOWN STRL REUS W/TWL XL LVL3 (GOWN DISPOSABLE) ×9 IMPLANT
KIT BASIN OR (CUSTOM PROCEDURE TRAY) ×3 IMPLANT
LIQUID BAND (GAUZE/BANDAGES/DRESSINGS) ×3 IMPLANT
PEN SKIN MARKING BROAD (MISCELLANEOUS) ×3 IMPLANT
POUCH RETRIEVAL ECOSAC 10 (ENDOMECHANICALS) ×1 IMPLANT
POUCH RETRIEVAL ECOSAC 10MM (ENDOMECHANICALS) ×2
RELOAD 45 VASCULAR/THIN (ENDOMECHANICALS) IMPLANT
RELOAD STAPLE TA45 3.5 REG BLU (ENDOMECHANICALS) IMPLANT
SCISSORS LAP 5X35 DISP (ENDOMECHANICALS) ×3 IMPLANT
SET IRRIG TUBING LAPAROSCOPIC (IRRIGATION / IRRIGATOR) ×3 IMPLANT
SHEARS HARMONIC ACE PLUS 36CM (ENDOMECHANICALS) IMPLANT
SLEEVE XCEL OPT CAN 5 100 (ENDOMECHANICALS) ×6 IMPLANT
STOPCOCK 4 WAY LG BORE MALE ST (IV SETS) IMPLANT
SUT ETHILON 2 0 PS N (SUTURE) IMPLANT
SUT MNCRL AB 4-0 PS2 18 (SUTURE) ×3 IMPLANT
SUT VICRYL 0 ENDOLOOP (SUTURE) IMPLANT
TOWEL OR 17X26 10 PK STRL BLUE (TOWEL DISPOSABLE) ×3 IMPLANT
TOWEL OR NON WOVEN STRL DISP B (DISPOSABLE) IMPLANT
TRAY LAPAROSCOPIC (CUSTOM PROCEDURE TRAY) ×3 IMPLANT
TROCAR BLADELESS OPT 5 100 (ENDOMECHANICALS) ×3 IMPLANT
TROCAR XCEL 12X100 BLDLESS (ENDOMECHANICALS) ×3 IMPLANT

## 2014-09-15 NOTE — H&P (Signed)
Amanda Mahoney is an 24 y.o. female.   Chief Complaint: abdominal pain HPI: Amanda Mahoney is a 24yo female now 22wga pregnant who has had severe abdominal pain for the last 2 months. She has been to the ER twice and is not eating well due to pain. Pain is in her RUQ and worse with meals. Work up revealed cholelithiasis with borderline wall thickening.  Past Medical History  Diagnosis Date  . 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 GERD (gastroesophageal reflux disease)   . Depression     11/2 YR AGO HOSPITALIZED FOR DEPRESSION IN [place]  . Currently pregnant     [redacted] WEEKS    History reviewed. No pertinent past surgical history.  Family History  Problem Relation Age of Onset  . Hypertension Mother   . Hypertension Sister   . Hypertension Maternal Grandmother    Social History:  reports that she has never smoked. She has never used smokeless tobacco. She reports that she does not drink alcohol or use illicit drugs.  Allergies:  Allergies  Allergen Reactions  . Latex Rash    Medications Prior to Admission  Medication Sig Dispense Refill  . acetaminophen (TYLENOL) 500 MG tablet Take 2 tablets (1,000 mg total) by mouth every 8 (eight) hours as needed. (Patient taking differently: Take 1,000 mg by mouth every 8 (eight) hours as needed for mild pain or headache. ) 30 tablet 0  . pantoprazole (PROTONIX) 20 MG tablet Take 1 tablet (20 mg total) by mouth 2 (two) times daily. 60 tablet 1  . traMADol (ULTRAM) 50 MG tablet Take 1 tablet (50 mg total) by mouth every 6 (six) hours as needed. (Patient taking differently: Take 50 mg by mouth every 6 (six) hours as needed for moderate pain. ) 20 tablet 0    Results for orders placed or performed during the hospital encounter of 09/14/14 (from the past 48 hour(s))  CBC     Status: Abnormal   Collection Time: 09/14/14  9:55 AM  Result Value Ref Range   WBC 12.1 (H) 4.0 - 10.5 K/uL   RBC 4.05 3.87 - 5.11 MIL/uL   Hemoglobin 11.7 (L)  12.0 - 15.0 g/dL   HCT 16.1 (L) 09.6 - 04.5 %   MCV 88.6 78.0 - 100.0 fL   MCH 28.9 26.0 - 34.0 pg   MCHC 32.6 30.0 - 36.0 g/dL   RDW 40.9 81.1 - 91.4 %   Platelets 331 150 - 400 K/uL   No results found.  Review of Systems  Constitutional: Negative for fever.  Respiratory: Negative for cough and hemoptysis.   Cardiovascular: Negative for chest pain.  Gastrointestinal: Positive for heartburn, nausea and abdominal pain.  Genitourinary: Negative for dysuria and urgency.  Musculoskeletal: Negative for myalgias and neck pain.  Skin: Negative for itching and rash.  Neurological: Negative for dizziness and tingling.  Endo/Heme/Allergies: Negative for environmental allergies. Does not bruise/bleed easily.  Psychiatric/Behavioral: Negative for depression and suicidal ideas.    Blood pressure 120/75, pulse 89, temperature 98.1 F (36.7 C), temperature source Oral, resp. rate 16, height 5' (1.524 m), weight 106.142 kg (234 lb), SpO2 100 %. Physical Exam  Constitutional: She is oriented to person, place, and time. She appears well-developed and well-nourished.  HENT:  Head: Normocephalic and atraumatic.  Neck: Normal range of motion. Neck supple.  Cardiovascular: Normal rate and regular rhythm.   Respiratory: Effort normal and breath sounds normal.  GI: Soft. There is tenderness.  Musculoskeletal: Normal range of motion. She exhibits no edema.  Neurological: She is alert and oriented to person, place, and time.  Skin: Skin is warm and dry.     Assessment/Plan 24 yo female 22wga with cholecystitis. She presents today for lap cholecystectomy -We discussed in depth the risk cholecystitis puts to her child and treating through the symptoms versus surgery -We discussed the risks of surgery including liver injury, common bile duct injury, loss of fetus, need to make larger incision, loose bowel movements, hernia. -She stated she understood the risks and decided to proceed. An interpreter was  present to help answer any questions, though Nayleen's understanding of English was good. -stay overnight for monitoring -FHT post op  De Blanch Kinsinger 09/15/2014, 11:24 AM

## 2014-09-15 NOTE — Anesthesia Procedure Notes (Signed)
Procedure Name: Intubation Date/Time: 09/15/2014 12:17 PM Performed by: Carolyne Fiscal, Skylah Delauter F Pre-anesthesia Checklist: Patient identified, Emergency Drugs available, Suction available, Patient being monitored and Timeout performed Patient Re-evaluated:Patient Re-evaluated prior to inductionOxygen Delivery Method: Circle system utilized Preoxygenation: Pre-oxygenation with 100% oxygen Intubation Type: IV induction Laryngoscope Size: Miller and 2 Grade View: Grade I Tube type: Oral Tube size: 7.0 mm Number of attempts: 1 Airway Equipment and Method: Stylet Placement Confirmation: ETT inserted through vocal cords under direct vision,  positive ETCO2 and breath sounds checked- equal and bilateral Secured at: 22 cm Tube secured with: Tape Dental Injury: Teeth and Oropharynx as per pre-operative assessment

## 2014-09-15 NOTE — Anesthesia Postprocedure Evaluation (Signed)
  Anesthesia Post-op Note  Patient: Amanda Mahoney  Procedure(s) Performed: Procedure(s): LAPAROSCOPIC CHOLECYSTECTOMY  (N/A)  Patient Location: PACU  Anesthesia Type:General  Level of Consciousness: awake and alert   Airway and Oxygen Therapy: Patient Spontanous Breathing  Post-op Pain: mild  Post-op Assessment: Post-op Vital signs reviewed              Post-op Vital Signs: stable  Last Vitals:  Filed Vitals:   09/15/14 1448  BP: 131/69  Pulse: 90  Temp: 37.2 C  Resp: 20    Complications: No apparent anesthesia complications

## 2014-09-15 NOTE — Progress Notes (Signed)
Spoke with Florentina Addison, RN with OB Rapid Response Team. Stated she spoke with Dr. Sheliah Hatch and she will arrive for fetal heart tones to [facility] once patient is out of surgery. Number placed on chart.

## 2014-09-15 NOTE — Anesthesia Preprocedure Evaluation (Addendum)
Anesthesia Evaluation  Patient identified by MRN, date of birth, ID band Patient awake  General Assessment Comment:[redacted] weeks pregnant  Reviewed: Allergy & Precautions, NPO status , Patient's Chart, lab work & pertinent test results  History of Anesthesia Complications Negative for: history of anesthetic complications  Airway Mallampati: I  TM Distance: >3 FB Neck ROM: Full    Dental  (+) Teeth Intact   Pulmonary    breath sounds clear to auscultation       Cardiovascular negative cardio ROS   Rhythm:Regular Rate:Normal     Neuro/Psych    GI/Hepatic Neg liver ROS, GERD  ,  Endo/Other  negative endocrine ROS  Renal/GU negative Renal ROS     Musculoskeletal negative musculoskeletal ROS (+)   Abdominal (+) + obese,   Peds  Hematology negative hematology ROS (+)   Anesthesia Other Findings   Reproductive/Obstetrics                            Anesthesia Physical Anesthesia Plan  ASA: II  Anesthesia Plan: General   Post-op Pain Management:    Induction: Intravenous  Airway Management Planned: Oral ETT  Additional Equipment:   Intra-op Plan:   Post-operative Plan: Extubation in OR  Informed Consent: I have reviewed the patients History and Physical, chart, labs and discussed the procedure including the risks, benefits and alternatives for the proposed anesthesia with the patient or authorized representative who has indicated his/her understanding and acceptance.     Plan Discussed with:   Anesthesia Plan Comments:         Anesthesia Quick Evaluation

## 2014-09-15 NOTE — Progress Notes (Signed)
Sandrea Hughs, R.N. -Rapid Response OB- R.N.-here to do fetal monitoring.

## 2014-09-15 NOTE — Op Note (Signed)
Preoperative diagnosis: chronic cholecystitis  Postoperative diagnosis: Same   Procedure: laparoscopic cholecystectomy  Surgeon: Feliciana Rossetti, M.D.  Assistant: Avel Peace, M.D.  Anesthesia: Gen.   Indications for procedure: Amanda Mahoney is a 24 y.o. female with symptoms of RUQ pain and Nausea consistent with gallbladder disease, Confirmed by Ultrasound. She is 22wga pregnant and had been to the ER multiple times and was not able to tolerate any food. Due to her risk of further complications the patient decided to proceed with cholecystectomy during the 2nd trimester.  Description of procedure: The patient was brought into the operative suite, placed supine. Anesthesia was administered with endotracheal tube. Patient was strapped in place and foot board was secured. All pressure points were offloaded by foam padding. The patient was prepped and draped in the usual sterile fashion.  A small transverse incision was made subcostal at the right midclavicular line. Pneumoperitoneum was applied with high flow low pressure. 2 5mm trocars were placed, one in the periumbilical space, 1 in the anterior axillary line. A 12mm trocar was placed in the subcostal space. All trocars sites were first anesthesized with 0.25% marcaine with epinephrine. Next the patient was placed in reverse trendelenberg. The gallbladder was retracted cephalad and lateral. The peritoneum was reflected off the infundibulum working lateral to medial.   The cystic duct and cystic artery were identified and further dissection revealed a critical view.  The cystic duct and cystic artery were doubly clipped and ligated.   The gallbladder was removed off the liver bed with cautery. During dissection a small hole was made in the gallbladder, all sludge was evacuated from the abdomen and copious amount of irrigation was used to remove as much contamination as possible. The Gallbladder was placed in a specimen bag. The gallbladder  fossa was irrigated and hemostasis was applied with cautery. The gallbladder was removed via the umbilicus. 0 vicryl was used to close the fascial defect. Pneumoperitoneum was removed, all trocar were removed. All incisions were closed with 4-0 monocryl subcuticular stitch. The patient woke from anesthesia and was brought to PACU in stable condition.  Findings: cholelithiasis with chronic cholecystitis  Specimen: gallbldadder  Blood loss: <50cc  Local anesthesia: 25cc 0.25% marcaine with epi  Complications: none  Feliciana Rossetti, M.D. General, Bariatric, & Minimally Invasive Surgery [facility]

## 2014-09-15 NOTE — Transfer of Care (Signed)
Immediate Anesthesia Transfer of Care Note  Patient: Amanda Mahoney  Procedure(s) Performed: Procedure(s): LAPAROSCOPIC CHOLECYSTECTOMY  (N/A)  Patient Location: PACU  Anesthesia Type:General  Level of Consciousness: awake, alert  and oriented  Airway & Oxygen Therapy: Patient Spontanous Breathing and Patient connected to face mask oxygen  Post-op Assessment: Report given to RN and Post -op Vital signs reviewed and stable  Post vital signs: Reviewed and stable  Last Vitals:  Filed Vitals:   09/15/14 1017  BP: 120/75  Pulse: 89  Temp: 36.7 C  Resp: 16    Complications: No apparent anesthesia complications

## 2014-09-16 ENCOUNTER — Encounter (HOSPITAL_COMMUNITY): Payer: Self-pay | Admitting: General Surgery

## 2014-09-16 DIAGNOSIS — O99612 Diseases of the digestive system complicating pregnancy, second trimester: Secondary | ICD-10-CM | POA: Diagnosis not present

## 2014-09-16 LAB — CBC
HEMATOCRIT: 32.5 % — AB (ref 36.0–46.0)
HEMOGLOBIN: 10.7 g/dL — AB (ref 12.0–15.0)
MCH: 29.4 pg (ref 26.0–34.0)
MCHC: 32.9 g/dL (ref 30.0–36.0)
MCV: 89.3 fL (ref 78.0–100.0)
PLATELETS: 309 10*3/uL (ref 150–400)
RBC: 3.64 MIL/uL — AB (ref 3.87–5.11)
RDW: 13.2 % (ref 11.5–15.5)
WBC: 16 10*3/uL — AB (ref 4.0–10.5)

## 2014-09-16 MED ORDER — OXYCODONE HCL 5 MG PO TABS
10.0000 mg | ORAL_TABLET | ORAL | Status: DC | PRN
  Administered 2014-09-16 – 2014-09-17 (×5): 10 mg via ORAL
  Filled 2014-09-16 (×5): qty 2

## 2014-09-16 NOTE — Progress Notes (Signed)
Pt has tachycardia of 125 during night w/ hypotension trending up.  Pt asymptomatic; no c/o dizziness; up to BR frequently and walking in hall; voiding large amts w/o difficulty.  Pt states she feels frequent movements from fetus.  Dr. Derrell Lolling on floor and notified.  Order for CBC received and entered.

## 2014-09-16 NOTE — Progress Notes (Signed)
Patient states that she has felt "baby moving" all day.

## 2014-09-17 DIAGNOSIS — O99612 Diseases of the digestive system complicating pregnancy, second trimester: Secondary | ICD-10-CM | POA: Diagnosis not present

## 2014-09-17 MED ORDER — DOCUSATE SODIUM 100 MG PO CAPS: 100.0000 mg | ORAL_CAPSULE | Freq: Two times a day (BID) | ORAL | Status: DC

## 2014-09-17 MED ORDER — OXYCODONE HCL 10 MG PO TABS: 10.0000 mg | ORAL_TABLET | ORAL | Status: DC | PRN

## 2014-09-17 NOTE — Progress Notes (Signed)
Patient is ambulating in hall, tolerating reg diet, pain controlled with oral medications, surgical site unremarkable.  Discharge instructions and prescriptions given to patient.  Patient verbalizes understanding.

## 2014-09-17 NOTE — Progress Notes (Signed)
Patient verbalized that she feels her baby moving.  Marcia Brash, RN.

## 2014-09-17 NOTE — Progress Notes (Signed)
S: pain with deep breathing and movement. No nausea or vomiting O: tachycardic otherwise vitals stable Gen: NAD R; CTAB CV: tachy Ab: appropriate tender, nondistended, wound c/d/i A/P POD 1 lap chole, FHT normal yesterday and today -continue supportive care -increase pain control to oxycodone.

## 2014-09-17 NOTE — Discharge Summary (Signed)
Physician Discharge Summary  Patient ID: Amanda Mahoney MRN: [id number] DOB/AGE: [birth year] 24 y.o.  Admit date: 09/15/2014 Discharge date: 09/17/2014  Admission Diagnoses:  Discharge Diagnoses:  Active Problems:   Cholecystitis   Discharged Condition: good  Hospital Course: presented for gallbladder removal. After successful laparoscopic cholecystectomy she was dmited to the floor for observation. FHTs were performed mutliple times and were normal. She was tolerating a moderate amount of food POD 2 and subsequently discharged home.  Consults: obstetrics nursing  Significant Diagnostic Studies: normal FHTs  Treatments: lap chole  Discharge Exam: Blood pressure 107/63, pulse 120, temperature 98.9 F (37.2 C), temperature source Oral, resp. rate 20, height 5' (1.524 m), weight 108.092 kg (238 lb 4.8 oz), SpO2 91 %. General appearance: alert and no distress Head: Normocephalic, without obvious abnormality, atraumatic Resp: clear to auscultation bilaterally Cardio: tachycardic GI: soft, appropriately tender, nondistended, wounds c/d/i  Disposition: 01-Home or Self Care  Discharge Instructions    Diet - low sodium heart healthy    Complete by:  As directed      Increase activity slowly    Complete by:  As directed             Medication List    STOP taking these medications        acetaminophen 500 MG tablet  Commonly known as:  TYLENOL      TAKE these medications        docusate sodium 100 MG capsule  Commonly known as:  COLACE  Take 1 capsule (100 mg total) by mouth 2 (two) times daily.     Oxycodone HCl 10 MG Tabs  Take 1 tablet (10 mg total) by mouth every 4 (four) hours as needed for moderate pain or severe pain.     pantoprazole 20 MG tablet  Commonly known as:  PROTONIX  Take 1 tablet (20 mg total) by mouth 2 (two) times daily.     traMADol 50 MG tablet  Commonly known as:  ULTRAM  Take 1 tablet (50 mg total) by mouth every 6 (six) hours as needed.            Follow-up Information    Follow up with Rodman Pickle, MD In 2 weeks.   Specialty:  General Surgery   Contact information:   [address] [place] [address] [phone]       Signed: De Blanch Kinsinger 09/17/2014, 8:29 AM

## 2014-09-30 ENCOUNTER — Other Ambulatory Visit: Payer: Self-pay | Admitting: Family Medicine

## 2014-10-07 ENCOUNTER — Ambulatory Visit (INDEPENDENT_AMBULATORY_CARE_PROVIDER_SITE_OTHER): Payer: Medicaid Other | Admitting: Obstetrics and Gynecology

## 2014-10-07 VITALS — BP 111/74 | HR 86 | Wt 232.1 lb

## 2014-10-07 DIAGNOSIS — O0932 Supervision of pregnancy with insufficient antenatal care, second trimester: Secondary | ICD-10-CM

## 2014-10-07 LAB — POCT URINALYSIS DIP (DEVICE)
BILIRUBIN URINE: NEGATIVE
GLUCOSE, UA: NEGATIVE mg/dL
Hgb urine dipstick: NEGATIVE
Nitrite: NEGATIVE
PROTEIN: NEGATIVE mg/dL
SPECIFIC GRAVITY, URINE: 1.015 (ref 1.005–1.030)
Urobilinogen, UA: 0.2 mg/dL (ref 0.0–1.0)
pH: 6 (ref 5.0–8.0)

## 2014-10-07 MED ORDER — PANTOPRAZOLE SODIUM 20 MG PO TBEC: 20.0000 mg | DELAYED_RELEASE_TABLET | Freq: Every day | ORAL | Status: DC

## 2014-10-07 NOTE — Progress Notes (Signed)
Subjective:  Amanda Mahoney is a 24 y.o. [phone] at [redacted]w[redacted]d being seen today for ongoing prenatal care.  Patient reports vaginal pressure. No contractions or bleeding or LOF.  Contractions: Not present.  Vag. Bleeding: None. Movement: Present. Denies leaking of fluid.   The following portions of the patient's history were reviewed and updated as appropriate: allergies, current medications, past family history, past medical history, past social history, past surgical history and problem list.   Objective:   Filed Vitals:   10/07/14 0931  BP: 111/74  Pulse: 86  Weight: 232 lb 1.6 oz (105.28 kg)    Fetal Status: Fetal Heart Rate (bpm): 144 Fundal Height: 39 cm Movement: Present     General:  Alert, oriented and cooperative. Patient is in no acute distress.  Skin: Skin is warm and dry. No rash noted.   Cardiovascular: Normal heart rate noted  Respiratory: Normal respiratory effort, no problems with respiration noted  Abdomen: Soft, gravid, appropriate for gestational age. Pain/Pressure: Present     Pelvic: Vag. Bleeding: None     Cervical exam deferred        Extremities: Normal range of motion.     Mental Status: Normal mood and affect. Normal behavior. Normal judgment and thought content.   Urinalysis: Urine Protein: Negative Urine Glucose: Negative  Assessment and Plan:  Pregnancy: [id number] at [redacted]w[redacted]d  # Pregnancy - no contractions, minimal vaginal pressure, no infectious symptoms, offered cervical check, pt declined for now - 1-hr, cbc, and hiv next visit in 3 wks, tdap then as well - written script for protonix which needs for her residential facility  There are no diagnoses linked to this encounter. Preterm labor symptoms and general obstetric precautions including but not limited to vaginal bleeding, contractions, leaking of fluid and fetal movement were reviewed in detail with the patient. Please refer to After Visit Summary for other counseling recommendations.  Return in about  3 weeks (around 10/28/2014).   Kathrynn Running, MD

## 2014-10-07 NOTE — Progress Notes (Signed)
Pt needs written rx for Protonix.  Pt reports feeling like the baby is coming out.

## 2014-10-12 ENCOUNTER — Inpatient Hospital Stay (HOSPITAL_COMMUNITY)
Admission: AD | Admit: 2014-10-12 | Discharge: 2014-10-12 | Disposition: A | Discharge status: Home / Self Care | Payer: Medicaid Other | Source: Ambulatory Visit | Attending: Family Medicine | Admitting: Family Medicine

## 2014-10-12 ENCOUNTER — Encounter (HOSPITAL_COMMUNITY): Payer: Self-pay | Admitting: *Deleted

## 2014-10-12 DIAGNOSIS — N949 Unspecified condition associated with female genital organs and menstrual cycle: Secondary | ICD-10-CM | POA: Diagnosis not present

## 2014-10-12 DIAGNOSIS — R102 Pelvic and perineal pain: Secondary | ICD-10-CM | POA: Insufficient documentation

## 2014-10-12 DIAGNOSIS — O26892 Other specified pregnancy related conditions, second trimester: Secondary | ICD-10-CM | POA: Insufficient documentation

## 2014-10-12 DIAGNOSIS — Z3A26 26 weeks gestation of pregnancy: Secondary | ICD-10-CM | POA: Diagnosis not present

## 2014-10-12 DIAGNOSIS — R109 Unspecified abdominal pain: Secondary | ICD-10-CM | POA: Diagnosis present

## 2014-10-12 DIAGNOSIS — O9989 Other specified diseases and conditions complicating pregnancy, childbirth and the puerperium: Secondary | ICD-10-CM | POA: Diagnosis not present

## 2014-10-12 DIAGNOSIS — O26899 Other specified pregnancy related conditions, unspecified trimester: Secondary | ICD-10-CM

## 2014-10-12 LAB — URINE MICROSCOPIC-ADD ON

## 2014-10-12 LAB — URINALYSIS, ROUTINE W REFLEX MICROSCOPIC
BILIRUBIN URINE: NEGATIVE
GLUCOSE, UA: NEGATIVE mg/dL
Hgb urine dipstick: NEGATIVE
Ketones, ur: NEGATIVE mg/dL
NITRITE: NEGATIVE
PH: 6 (ref 5.0–8.0)
Protein, ur: NEGATIVE mg/dL
Urobilinogen, UA: 0.2 mg/dL (ref 0.0–1.0)

## 2014-10-12 MED ORDER — PANTOPRAZOLE SODIUM 20 MG PO TBEC: 20.0000 mg | DELAYED_RELEASE_TABLET | Freq: Two times a day (BID) | ORAL | Status: DC

## 2014-10-12 NOTE — MAU Provider Note (Signed)
History     CSN: [id number]  Arrival date and time: 10/12/14 1744   First Provider Initiated Contact with Patient 10/12/14 1822         Chief Complaint  Patient presents with  . Abdominal Pain   HPI  Amanda Mahoney is a 24 y.o. [phone] at [redacted]w[redacted]d who presents with abdominal pain.  Sharp lower abdominal pain x 2 weeks. Worse today. Has only occurred twice today. States worse with position changes and walking.  Denies contraction, LOF, or vaginal bleeding.  Some vaginal pressure.  Positive fetal movement.  Last intercourse at beginning of pregnancy.  Currently no pain.     OB History    Gravida Para Term Preterm AB TAB SAB Ectopic Multiple Living   0 2 0 2 0 0 1      Past Medical History  Diagnosis Date  . 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 GERD (gastroesophageal reflux disease)   . Depression     11/2 YR AGO HOSPITALIZED FOR DEPRESSION IN [place]  . Currently pregnant     [redacted] WEEKS    Past Surgical History  Procedure Laterality Date  . Cholecystectomy N/A 09/15/2014    Procedure: LAPAROSCOPIC CHOLECYSTECTOMY ;  Surgeon: De Blanch Kinsinger, MD;  Location: [facility];  Service: General;  Laterality: N/A;    Family History  Problem Relation Age of Onset  . Hypertension Mother   . Hypertension Sister   . Hypertension Maternal Grandmother     Social History  Substance Use Topics  . Smoking status: Never Smoker   . Smokeless tobacco: Never Used  . Alcohol Use: No    Allergies:  Allergies  Allergen Reactions  . Latex Rash    Prescriptions prior to admission  Medication Sig Dispense Refill Last Dose  . docusate sodium (COLACE) 100 MG capsule Take 1 capsule (100 mg total) by mouth 2 (two) times daily. (Patient not taking: Reported on 10/07/2014) 20 capsule 0 Not Taking  . oxyCODONE 10 MG TABS Take 1 tablet (10 mg total) by mouth every 4 (four) hours as needed for moderate pain or severe pain. (Patient not taking: Reported on  10/07/2014) 30 tablet 0 Not Taking  . pantoprazole (PROTONIX) 20 MG tablet Take 1 tablet (20 mg total) by mouth daily. 60 tablet 6   . traMADol (ULTRAM) 50 MG tablet Take 1 tablet (50 mg total) by mouth every 6 (six) hours as needed. (Patient not taking: Reported on 10/07/2014) 20 tablet 0 Not Taking    Review of Systems  Constitutional: Negative.   Gastrointestinal: Positive for abdominal pain. Negative for nausea, vomiting, diarrhea and constipation.  Genitourinary: Negative.   Musculoskeletal: Positive for back pain.   Physical Exam   Blood pressure 116/66, pulse 118, temperature 98.2 F (36.8 C), resp. rate 18, height  (1.499 m).  Physical Exam  Nursing note and vitals reviewed. Constitutional: She is oriented to person, place, and time. She appears well-developed and well-nourished. No distress.  HENT:  Head: Normocephalic and atraumatic.  Eyes: Conjunctivae are normal. Right eye exhibits no discharge. Left eye exhibits no discharge. No scleral icterus.  Neck: Normal range of motion.  Cardiovascular: Normal rate, regular rhythm and normal heart sounds.   No murmur heard. Respiratory: Effort normal and breath sounds normal. No respiratory distress. She has no wheezes.  GI: Soft. There is no tenderness.  Neurological: She is alert and oriented to person, place, and time.  Skin: Skin  is warm and dry. She is not diaphoretic.  Psychiatric: She has a normal mood and affect. Her behavior is normal. Judgment and thought content normal.   Dilation: Closed (external 1/internal closed) Effacement (%): Thick Cervical Position: Anterior Exam by:: Estanislado Spire, NP  Fetal Tracing:  Baseline: 140 Variability: moderate Accelerations: present Decelerations: absent  Toco: few UI   MAU Course  Procedures Results for orders placed or performed during the hospital encounter of 10/12/14 (from the past 24 hour(s))  Urinalysis, Routine w reflex microscopic (not at [facility])     Status:  Abnormal   Collection Time: 10/12/14  6:00 PM  Result Value Ref Range   Color, Urine AMBER (A) YELLOW   APPearance CLEAR CLEAR   Specific Gravity, Urine >1.030 (H) 1.005 - 1.030   pH 6.0 5.0 - 8.0   Glucose, UA NEGATIVE NEGATIVE mg/dL   Hgb urine dipstick NEGATIVE NEGATIVE   Bilirubin Urine NEGATIVE NEGATIVE   Ketones, ur NEGATIVE NEGATIVE mg/dL   Protein, ur NEGATIVE NEGATIVE mg/dL   Urobilinogen, UA 0.2 0.0 - 1.0 mg/dL   Nitrite NEGATIVE NEGATIVE   Leukocytes, UA TRACE (A) NEGATIVE  Urine microscopic-add on     Status: Abnormal   Collection Time: 10/12/14  6:00 PM  Result Value Ref Range   Squamous Epithelial / LPF MANY (A) RARE   WBC, UA 0-2 <3 WBC/hpf   Crystals CA OXALATE CRYSTALS (A) NEGATIVE   Urine-Other MUCOUS PRESENT      MDM Cervix closed Category 1 tracing No contractions on monitor  Assessment and Plan  A: 1. Pain of round ligament affecting pregnancy, antepartum    P: Discharge home Increase water intake Preterm labor precautions Discussed slow position changes & maternity support belt  Judeth Horn, NP  10/12/2014, 6:06 PM

## 2014-10-12 NOTE — MAU Note (Signed)
Pt present to MAU with complaints of contractions earlier today, denies any contractions now. Denies any vaginal bleeding or LOF

## 2014-10-12 NOTE — Discharge Instructions (Signed)
Round Ligament Pain  The round ligament is a cord of muscle and tissue that helps to support the uterus. It can become a source of pain during pregnancy if it becomes stretched or twisted as the baby grows. The pain usually begins in the second trimester of pregnancy, and it can come and go until the baby is delivered. It is not a serious problem, and it does not cause harm to the baby.  Round ligament pain is usually a short, sharp, and pinching pain, but it can also be a dull, lingering, and aching pain. The pain is felt in the lower side of the abdomen or in the groin. It usually starts deep in the groin and moves up to the outside of the hip area. Pain can occur with:   A sudden change in position.   Rolling over in bed.   Coughing or sneezing.   Physical activity.  HOME CARE INSTRUCTIONS  Watch your condition for any changes. Take these steps to help with your pain:   When the pain starts, relax. Then try:    Sitting down.    Flexing your knees up to your abdomen.    Lying on your side with one pillow under your abdomen and another pillow between your legs.    Sitting in a warm bath for 15-20 minutes or until the pain goes away.   Take over-the-counter and prescription medicines only as told by your health care provider.   Move slowly when you sit and stand.   Avoid long walks if they cause pain.   Stop or lessen your physical activities if they cause pain.  SEEK MEDICAL CARE IF:   Your pain does not go away with treatment.   You feel pain in your back that you did not have before.   Your medicine is not helping.  SEEK IMMEDIATE MEDICAL CARE IF:   You develop a fever or chills.   You develop uterine contractions.   You develop vaginal bleeding.   You develop nausea or vomiting.   You develop diarrhea.   You have pain when you urinate.     This information is not intended to replace advice given to you by your health care provider. Make sure you discuss any questions you have with your health  care provider.     Document Released: 09/28/2007 Document Revised: 03/13/2011 Document Reviewed: 02/25/2014  Elsevier Interactive Patient Education 2016 [employer]

## 2014-10-14 ENCOUNTER — Encounter: Payer: Medicaid Other | Admitting: Certified Nurse Midwife

## 2014-10-14 LAB — CULTURE, OB URINE: Culture: >=100000

## 2014-10-15 ENCOUNTER — Other Ambulatory Visit: Payer: Self-pay | Admitting: Obstetrics and Gynecology

## 2014-10-15 ENCOUNTER — Ambulatory Visit (HOSPITAL_COMMUNITY)
Admission: RE | Admit: 2014-10-15 | Discharge: 2014-10-15 | Disposition: A | Discharge status: Home / Self Care | Payer: Medicaid Other | Source: Ambulatory Visit | Attending: Obstetrics and Gynecology | Admitting: Obstetrics and Gynecology

## 2014-10-15 DIAGNOSIS — O26642 Intrahepatic cholestasis of pregnancy, second trimester: Secondary | ICD-10-CM

## 2014-10-15 DIAGNOSIS — Z3A26 26 weeks gestation of pregnancy: Secondary | ICD-10-CM

## 2014-10-15 DIAGNOSIS — O26612 Liver and biliary tract disorders in pregnancy, second trimester: Secondary | ICD-10-CM | POA: Diagnosis not present

## 2014-10-15 DIAGNOSIS — O99212 Obesity complicating pregnancy, second trimester: Secondary | ICD-10-CM

## 2014-10-15 DIAGNOSIS — K831 Obstruction of bile duct: Secondary | ICD-10-CM | POA: Diagnosis not present

## 2014-10-15 DIAGNOSIS — O0932 Supervision of pregnancy with insufficient antenatal care, second trimester: Secondary | ICD-10-CM

## 2014-10-15 DIAGNOSIS — E669 Obesity, unspecified: Secondary | ICD-10-CM | POA: Diagnosis not present

## 2014-10-28 ENCOUNTER — Ambulatory Visit (INDEPENDENT_AMBULATORY_CARE_PROVIDER_SITE_OTHER): Payer: Medicaid Other | Admitting: Family

## 2014-10-28 VITALS — BP 111/78 | HR 89 | Temp 98.2°F | Wt 235.8 lb

## 2014-10-28 DIAGNOSIS — Z23 Encounter for immunization: Secondary | ICD-10-CM | POA: Diagnosis not present

## 2014-10-28 DIAGNOSIS — O0932 Supervision of pregnancy with insufficient antenatal care, second trimester: Secondary | ICD-10-CM

## 2014-10-28 DIAGNOSIS — O0933 Supervision of pregnancy with insufficient antenatal care, third trimester: Secondary | ICD-10-CM

## 2014-10-28 LAB — CBC
HEMATOCRIT: 32.9 % — AB (ref 36.0–46.0)
HEMOGLOBIN: 10.6 g/dL — AB (ref 12.0–15.0)
MCH: 26.6 pg (ref 26.0–34.0)
MCHC: 32.2 g/dL (ref 30.0–36.0)
MCV: 82.5 fL (ref 78.0–100.0)
MPV: 9.9 fL (ref 8.6–12.4)
Platelets: 400 10*3/uL (ref 150–400)
RBC: 3.99 MIL/uL (ref 3.87–5.11)
RDW: 13.8 % (ref 11.5–15.5)
WBC: 10.8 10*3/uL — AB (ref 4.0–10.5)

## 2014-10-28 LAB — POCT URINALYSIS DIP (DEVICE)
Bilirubin Urine: NEGATIVE
GLUCOSE, UA: NEGATIVE mg/dL
KETONES UR: 15 mg/dL — AB
Nitrite: NEGATIVE
PROTEIN: NEGATIVE mg/dL
SPECIFIC GRAVITY, URINE: 1.02 (ref 1.005–1.030)
Urobilinogen, UA: 0.2 mg/dL (ref 0.0–1.0)
pH: 6 (ref 5.0–8.0)

## 2014-10-28 MED ORDER — METOCLOPRAMIDE HCL 10 MG PO TABS: 10.0000 mg | ORAL_TABLET | Freq: Three times a day (TID) | ORAL | Status: DC

## 2014-10-28 MED ORDER — TETANUS-DIPHTH-ACELL PERTUSSIS 5-2.5-18.5 LF-MCG/0.5 IM SUSP
0.5000 mL | Freq: Once | INTRAMUSCULAR | Status: AC
  Administered 2014-10-28: 0.5 mL via INTRAMUSCULAR

## 2014-10-28 NOTE — Progress Notes (Signed)
Pain- pelvic Pt reports having N/V frequently and can not keep anything down; pt request medication to prevent N/V

## 2014-10-28 NOTE — Progress Notes (Signed)
Subjective:  Amanda Mahoney is a 24 y.o. [phone] at 3874w4d being seen today for ongoing prenatal care.  Patient reports nausea and vomiting.  Feels nauseous, vomit x 1 in past 24 hours.   Contractions: Not present.  Vag. Bleeding: None. Movement: Present. Denies leaking of fluid.   The following portions of the patient's history were reviewed and updated as appropriate: allergies, current medications, past family history, past medical history, past social history, past surgical history and problem list. Problem list updated.  Objective:   Filed Vitals:   10/28/14 1029  BP: 111/78  Pulse: 89  Temp: 98.2 F (36.8 C)  Weight: 235 lb 12.8 oz (106.958 kg)    Fetal Status: Fetal Heart Rate (bpm): 137   Movement: Present     General:  Alert, oriented and cooperative. Patient is in no acute distress.  Skin: Skin is warm and dry. No rash noted.   Cardiovascular: Normal heart rate noted  Respiratory: Normal respiratory effort, no problems with respiration noted  Abdomen: Soft, gravid, appropriate for gestational age. Pain/Pressure: Present     Pelvic: Vag. Bleeding: None     Cervical exam deferred        Extremities: Normal range of motion.  Edema: Trace  Mental Status: Normal mood and affect. Normal behavior. Normal judgment and thought content.   Urinalysis: Urine Protein: Negative Urine Glucose: Negative  Assessment and Plan:  Pregnancy: G4P1021 at 6374w4d  1. Late prenatal care affecting pregnancy in second trimester, antepartum - HIV antibody (with reflex) - CBC - RPR - Glucose Tolerance, 1 HR (50g) - Tdap today  2. Nausea and Vomiting in Pregnancy - RX Reglan   Preterm labor symptoms and general obstetric precautions including but not limited to vaginal bleeding, contractions, leaking of fluid and fetal movement were reviewed in detail with the patient. Please refer to After Visit Summary for other counseling recommendations.  Return in about 2 weeks (around  11/11/2014).   Eino FarberWalidah Kennith GainN Karim, CNM

## 2014-10-29 LAB — GLUCOSE TOLERANCE, 1 HOUR (50G) W/O FASTING: GLUCOSE 1 HOUR GTT: 160 mg/dL — AB (ref 70–140)

## 2014-10-29 LAB — RPR

## 2014-10-29 LAB — HIV ANTIBODY (ROUTINE TESTING W REFLEX): HIV 1&2 Ab, 4th Generation: NONREACTIVE

## 2014-11-04 ENCOUNTER — Encounter: Payer: Self-pay | Admitting: Family

## 2014-11-04 ENCOUNTER — Telehealth: Payer: Self-pay | Admitting: *Deleted

## 2014-11-04 ENCOUNTER — Other Ambulatory Visit: Payer: Self-pay | Admitting: Family

## 2014-11-04 ENCOUNTER — Encounter: Payer: Self-pay | Admitting: *Deleted

## 2014-11-04 DIAGNOSIS — O99019 Anemia complicating pregnancy, unspecified trimester: Secondary | ICD-10-CM | POA: Insufficient documentation

## 2014-11-04 MED ORDER — FERROUS SULFATE 325 (65 FE) MG PO TABS: 325.0000 mg | ORAL_TABLET | Freq: Two times a day (BID) | ORAL | Status: DC

## 2014-11-04 NOTE — Progress Notes (Signed)
Message routed to clinical staff to inform pt regarding anemia and RX for ferrous sulfate sent to pharmacy; Also to inform pt of elevated 1 hr and need for 3 hr.

## 2014-11-04 NOTE — Telephone Encounter (Addendum)
Per Rochele PagesWalidah Karim, CNM, pt has anemia and Rx has been sent to her pharmacy. She also needs 3hr GTT. I called and left message that I am calling with test result information as well as recommendations. Please call back and state whether a detailed message can be left on her voice mail.   **Pt also left message earlier today on nurse voice mail asking if she can take Children's Tylenol for pain because she has trouble swallowing pills.   11/3  [id number]  Called pt with interpreter Albertina SenegalMarly Adams and informed her of test results as stated above. Pt stated that she has obtained the iron tablets from her pharmacy. She also agreed to appt on 11/7 for 3hr GTT. Pt advised that she may take Children's tylenol.  Pt voiced understanding of all information and instructions given.

## 2014-11-09 ENCOUNTER — Other Ambulatory Visit: Payer: Medicaid Other

## 2014-11-09 ENCOUNTER — Encounter: Payer: Self-pay | Admitting: *Deleted

## 2014-11-09 DIAGNOSIS — R7309 Other abnormal glucose: Secondary | ICD-10-CM

## 2014-11-10 LAB — GLUCOSE TOLERANCE, 3 HOURS
Glucose Tolerance, 1 hour: 208 mg/dL — ABNORMAL HIGH (ref 70–189)
Glucose Tolerance, 2 hour: 146 mg/dL (ref 70–164)
Glucose Tolerance, Fasting: 91 mg/dL (ref 65–99)
Glucose, GTT - 3 Hour: 86 mg/dL (ref 70–144)

## 2014-11-11 ENCOUNTER — Ambulatory Visit (INDEPENDENT_AMBULATORY_CARE_PROVIDER_SITE_OTHER): Payer: Medicaid Other | Admitting: Student

## 2014-11-11 ENCOUNTER — Encounter: Payer: Medicaid Other | Admitting: Student

## 2014-11-11 VITALS — BP 116/71 | HR 115 | Temp 98.4°F | Wt 237.5 lb

## 2014-11-11 DIAGNOSIS — O0932 Supervision of pregnancy with insufficient antenatal care, second trimester: Secondary | ICD-10-CM

## 2014-11-11 LAB — POCT URINALYSIS DIP (DEVICE)
BILIRUBIN URINE: NEGATIVE
GLUCOSE, UA: NEGATIVE mg/dL
HGB URINE DIPSTICK: NEGATIVE
Ketones, ur: NEGATIVE mg/dL
NITRITE: NEGATIVE
Protein, ur: NEGATIVE mg/dL
Specific Gravity, Urine: 1.02 (ref 1.005–1.030)
Urobilinogen, UA: 0.2 mg/dL (ref 0.0–1.0)
pH: 6.5 (ref 5.0–8.0)

## 2014-11-11 NOTE — Progress Notes (Signed)
Subjective:  Amanda Mahoney is a 24 y.o. [phone] at [redacted]w[redacted]d being seen today for ongoing prenatal care.  Patient reports no complaints.  Contractions: Not present.  Vag. Bleeding: None. Movement: Present. Denies leaking of fluid.   The following portions of the patient's history were reviewed and updated as appropriate: allergies, current medications, past family history, past medical history, past social history, past surgical history and problem list. Problem list updated.  Objective:   Filed Vitals:   11/11/14 1107  BP: 116/71  Pulse: 115  Temp: 98.4 F (36.9 C)  Weight: 237 lb 8 oz (107.729 kg)    Fetal Status: Fetal Heart Rate (bpm): 132   Movement: Present     General:  Alert, oriented and cooperative. Patient is in no acute distress.  Skin: Skin is warm and dry. No rash noted.   Cardiovascular: Normal heart rate noted  Respiratory: Normal respiratory effort, no problems with respiration noted  Abdomen: Soft, gravid, appropriate for gestational age. Pain/Pressure: Present     Pelvic: Vag. Bleeding: None     Cervical exam deferred        Extremities: Normal range of motion.  Edema: Trace  Mental Status: Normal mood and affect. Normal behavior. Normal judgment and thought content.   Urinalysis: Urine Protein: Negative Urine Glucose: Negative  Assessment and Plan:  Pregnancy: G4P1021 at [redacted]w[redacted]d  1. Late prenatal care affecting pregnancy in second trimester, antepartum    Preterm labor symptoms and general obstetric precautions including but not limited to vaginal bleeding, contractions, leaking of fluid and fetal movement were reviewed in detail with the patient. Please refer to After Visit Summary for other counseling recommendations.  No Follow-up on file.   Judeth HornErin Kampbell Holaway, NP

## 2014-11-11 NOTE — Patient Instructions (Signed)
Informacin sobre Government social research officer  (Preterm Labor Information) El parto prematuro comienza antes de la semana 37 de Amanda Mahoney. La duracin de un embarazo normal es de 39 a 41 semanas.  CAUSAS  Generalmente no hay una causa que pueda identificarse del motivo por el que una mujer comienza un trabajo de parto prematuro. Sin embargo, una de las causas conocidas ms frecuentes son las infecciones. Las infecciones del tero, el cuello, la vagina, el lquido [place], la vejiga, los riones y Teacher, adult Mahoney de los pulmones (neumona) pueden hacer que el trabajo de parto se inicie. Otras causas que pueden sospecharse son:   Infecciones urogenitales, como infecciones por hongos y vaginosis bacteriana.   Anormalidades uterinas (forma del tero, sptum uterino, fibromas, hemorragias en la placenta).   Un cuello que ha sido operado (puede ser que no permanezca cerrado).   Malformaciones del feto.   Gestaciones mltiples (mellizos, trillizos y ms).   Ruptura del saco amnitico.  FACTORES DE RIESGO  1. Historia previa de [employer].  2. Tener ruptura prematura de las membranas (RPM).  3. La placenta cubre la abertura del cuello (placenta previa).  4. La placenta se separa del tero (abrupcin placentaria).  5. El cuello es demasiado dbil para contener al beb en el tero (cuello incompetente).  6. Hay mucho lquido en el saco amnitico (polihidramnios).  7. Consumo de drogas o hbito de fumar durante el embarazo.  8. No aumentar de peso lo suficiente durante el embarazo.  9. Mujeres menores de [address] o [address] de [address] [place].  10. Nivel socioeconmico bajo.  11. Pertenecer a la Engineer, agricultural. SNTOMAS  Los signos y sntomas del trabajo de parto prematuro son:   Public librarian similares a los Designer, jewellery, dolor abdominal o dolor de espalda.  Contracciones uterinas regulares, tan frecuentes como seis por hora, sin importar su intensidad (pueden ser suaves o dolorosas).  Contracciones  que comienzan en la parte superior del tero y se expanden hacia abajo, a la zona inferior del abdomen y la espalda.   Sensacin de aumento de presin en la pelvis.   Aparece una secrecin acuosa o sanguinolenta por la vagina.  TRATAMIENTO  Segn el tiempo del embarazo y otras Mora, el mdico puede indicar reposo en cama. Si es necesario, le indicarn medicamentos para TEFL teacher las contracciones y para Customer service manager los pulmones del feto. Si el trabajo de parto se inicia antes de las 34 semanas de Amanda Mahoney, se recomienda la hospitalizacin. El tratamiento depende de las condiciones en que se encuentren usted y el feto.  QU DEBE HACER SI PIENSA QUE EST EN TRABAJO DE PARTO PREMATURO?  Comunquese con su mdico inmediatamente. Debe concurrir al hospital para ser controlada inmediatamente.  CMO PUEDE EVITAR EL TRABAJO DE PARTO PREMATURO EN FUTUROS EMBARAZOS?  Usted debe:   Si fuma, abandonar el hbito.  Mantener un peso saludable y evitar sustancias qumicas y drogas innecesarias.  Controlar todo tipo de infeccin.  Informe a su mdico si tiene una historia conocida de trabajo de parto prematuro.   Esta informacin no tiene Theme park manager el consejo del mdico. Asegrese de hacerle al mdico cualquier pregunta que tenga.   Document Released: 03/28/2007 Document Revised: 08/21/2012 Elsevier Interactive Patient Mahoney 2016 Amanda Mahoney.    Evaluacin de los movimientos fetales  (Fetal Movement Counts) Nombre del paciente: __________________________________________________ Amanda Mahoney estimada: ____________________ Amanda Mahoney de los movimientos fetales es muy recomendable en los embarazos de alto riesgo, pero tambin es una buena idea que lo hagan todas las Amanda Mahoney.  El Firefighter que comience a contarlos a las 28 semanas de Amanda Mahoney. Los movimientos fetales suelen aumentar:   Despus de Animator.  Despus de la actividad fsica.  Despus  de comer o beber Amanda Mahoney o fro.  En reposo. Preste atencin cuando sienta que el beb est ms activo. Esto le ayudar a notar un patrn de ciclos de vigilia y sueo de su beb y cules son los factores que contribuyen a un aumento de los movimientos fetales. Es importante llevar a cabo un recuento de movimientos fetales, al mismo tiempo cada da, cuando el beb normalmente est ms activo.  CMO CONTAR LOS MOVIMIENTOS FETALES 12. Busque un lugar tranquilo y cmodo para sentarse o recostarse sobre el lado izquierdo. Al recostarse sobre su lado izquierdo, le proporciona una mejor circulacin de Amanda Mahoney y oxgeno al beb. 13. Anote el da y la hora en una hoja de papel o en un diario. 14. Comience contando las pataditas, revoloteos, chasquidos, vueltas o pinchazos en un perodo de 2 horas. Debe sentir al menos 10 movimientos en 2 horas. 15. Si no siente 10 movimientos en 2 horas, espere 2  3 horas y cuente de nuevo. Busque cambios en el patrn o si no cuenta lo suficiente en 2 horas. SOLICITE ATENCIN MDICA SI:   Siente menos de 10 pataditas en 2 horas, en dos intentos.  No hay movimientos durante una hora.  El patrn se modifica o le lleva ms tiempo Art gallery manager las 10 pataditas.  Siente que el beb no se mueve como lo hace habitualmente. Fecha: ____________ Movimientos: ____________ [name] inicio: ____________ [name] finalizacin: ____________  [name]: ____________ Movimientos: ____________ [name] inicio: ____________ [name] finalizacin: ____________  [name]: ____________ Movimientos: ____________ [name] inicio: ____________ [name] finalizacin: ____________  [name]: ____________ Movimientos: ____________ [name] inicio: ____________ [name] finalizacin: ____________  [name]: ____________ Movimientos: ____________ [name] inicio: ____________ [name] de finalizacin: ____________  [name]: ____________ Movimientos: ____________ [name] de inicio: ____________ [name] de finalizacin:  ____________  [name]: ____________ Movimientos: ____________ [name] de inicio: ____________ [name] de finalizacin: ____________  [name]: ____________ Movimientos: ____________ [name] de inicio: ____________ [name] de finalizacin: ____________  [name]: ____________ Movimientos: ____________ [name] de inicio: ____________ [name] de finalizacin: ____________  [name]: ____________ Movimientos: ____________ [name] de inicio: ____________ [name] de finalizacin: ____________  [name]: ____________ Movimientos: ____________ [name] de inicio: ____________ [name] de finalizacin: ____________  [name]: ____________ Movimientos: ____________ [name] de inicio: ____________ [name] de finalizacin: ____________  [name]: ____________ Movimientos: ____________ [name] de inicio: ____________ [name] de finalizacin: ____________  [name]: ____________ Movimientos: ____________ [name] de inicio: ____________ [name] de finalizacin: ____________  [name]: ____________ Movimientos: ____________ [name] de inicio: ____________ [name] de finalizacin: ____________  [name]: ____________ Movimientos: ____________ [name] de inicio: ____________ [name] de finalizacin: ____________  [name]: ____________ Movimientos: ____________ [name] de inicio: ____________ [name] de finalizacin: ____________  [name]: ____________ Movimientos: ____________ [name] de inicio: ____________ [name] de finalizacin: ____________  [name]: ____________ Movimientos: ____________ [name] de inicio: ____________ [name] de finalizacin: ____________  [name]: ____________ Movimientos: ____________ [name] de inicio: ____________ [name] de finalizacin: ____________  [name]: ____________ Movimientos: ____________ [name] de inicio: ____________ [name] de finalizacin: ____________  [name]: ____________ Movimientos: ____________ [name] de inicio: ____________ [name] de finalizacin: ____________  [name]: ____________ Movimientos: ____________ [name] de inicio: ____________ [name] de finalizacin: ____________  [name]: ____________  Movimientos: ____________ [name] de inicio: ____________ [name] de finalizacin: ____________  [name]: ____________ Movimientos: ____________ [name] de inicio: ____________ [name] de finalizacin: ____________  [name]: ____________  Movimientos: ____________ [name] inicio: ____________ [name] finalizacin: ____________  [name]: ____________ Movimientos: ____________ [name] inicio: ____________ [name] finalizacin: ____________  [name]: ____________ Movimientos: ____________ [name] inicio: ____________ [name] finalizacin: ____________  [name]: ____________ Movimientos: ____________ [name] inicio: ____________ [name] finalizacin: ____________  [name]: ____________ Movimientos: ____________ [name] inicio: ____________ [name] finalizacin: ____________  [name]: ____________ Movimientos: ____________ [name] de inicio: ____________ [name] de finalizacin: ____________  [name]: ____________ Movimientos: ____________ [name] de inicio: ____________ [name] de finalizacin: ____________  [name]: ____________ Movimientos: ____________ [name] de inicio: ____________ [name] de finalizacin: ____________  [name]: ____________ Movimientos: ____________ [name] de inicio: ____________ [name] de finalizacin: ____________  [name]: ____________ Movimientos: ____________ [name] de inicio: ____________ [name] de finalizacin: ____________  [name]: ____________ Movimientos: ____________ [name] de inicio: ____________ [name] de finalizacin: ____________  [name]: ____________ Movimientos: ____________ [name] de inicio: ____________ [name] de finalizacin: ____________  [name]: ____________ Movimientos: ____________ [name] de inicio: ____________ [name] de finalizacin: ____________  [name]: ____________ Movimientos: ____________ [name] de inicio: ____________ [name] de finalizacin: ____________  [name]: ____________ Movimientos: ____________ [name] de inicio: ____________ [name] de finalizacin: ____________  [name]: ____________ Movimientos: ____________ [name] de inicio:  ____________ [name] de finalizacin: ____________  [name]: ____________ Movimientos: ____________ [name] de inicio: ____________ [name] de finalizacin: ____________  [name]: ____________ Movimientos: ____________ [name] de inicio: ____________ [name] de finalizacin: ____________  [name]: ____________ Movimientos: ____________ [name] de inicio: ____________ [name] de finalizacin: ____________  [name]: ____________ Movimientos: ____________ [name] de inicio: ____________ [name] de finalizacin: ____________  [name]: ____________ Movimientos: ____________ [name] de inicio: ____________ [name] de finalizacin: ____________  [name]: ____________ Movimientos: ____________ [name] de inicio: ____________ [name] de finalizacin: ____________  [name]: ____________ Movimientos: ____________ [name] de inicio: ____________ [name] de finalizacin: ____________  [name]: ____________ Movimientos: ____________ [name] de inicio: ____________ [name] de finalizacin: ____________  [name]: ____________ Movimientos: ____________ [name] de inicio: ____________ [name] de finalizacin: ____________  [name]: ____________ Movimientos: ____________ [name] de inicio: ____________ [name] de finalizacin: ____________  [name]: ____________ Movimientos: ____________ [name] de inicio: ____________ [name] de finalizacin: ____________  [name]: ____________ Movimientos: ____________ [name] de inicio: ____________ [name] de finalizacin: ____________  [name]: ____________ Movimientos: ____________ [name] de inicio: ____________ [name] de finalizacin: ____________  [name]: ____________ Movimientos: ____________ [name] de inicio: ____________ [name] de finalizacin: ____________  [name]: ____________ Movimientos: ____________ [name] de inicio: ____________ [name] de finalizacin: ____________    Esta informacin no tiene como fin reemplazar el consejo del mdico. Asegrese de hacerle al mdico cualquier pregunta que tenga.   Document Released: 03/28/2007 Document Revised: 12/06/2011 Elsevier Interactive  Patient Mahoney 2016 Amanda Mahoney.    [employer] medicinas seguras para tomar durante el embarazo  Safe Medications in Pregnancy  Acn:  Benzoyl Peroxide (Perxido de benzolo)  Salicylic Acid (cido saliclico)  Dolor de espalda/Dolor de cabeza:  Tylenol: 2 pastillas de concentracin regular cada 4 horas O 2 pastillas de concentracin fuerte cada 6 horas  Resfriados/Tos/Alergias:  Benadryl (sin alcohol) 25 mg cada 6 horas segn lo necesite Breath Right strips (Tiras para respirar correctamente)  Claritin  Cepacol (pastillas de chupar para la garganta)  Chloraseptic (aerosol para la garganta)  Cold-Eeze- hasta tres veces por da  Cough drops (pastillas de chupar para la tos, sin alcohol)  Flonase (con receta mdica solamente)  Guaifenesin  Mucinex  Robitussin DM (simple solamente, sin alcohol)  Saline nasal spray/drops (Aerosol nasal salino/gotas) Sudafed (pseudoephedrine) y  Actifed * utilizar slo despus de 12 semanas de gestacin y si no tiene la presin arterial alta.  Tylenol Vicks  VapoRub  Zinc lozenges (pastillas para la garganta)  Zyrtec  Estreimiento:  Colace  Ducolax (supositorios)  Fleet enema (lavado intestinal rectal)  Glycerin (supositorios)  Metamucil  Milk of magnesia (leche de magnesia)  Miralax  Senokot  Smooth Move (t)  Diarrea:  Kaopectate Imodium A-D  *NO tome Pepto-Bismol  Hemorroides:  Anusol  Anusol HC  Preparation H  Tucks  Indigestin:  Tums  Maalox  Mylanta  Zantac  Pepcid  Insomnia:  Benadryl (sin alcohol) 25mg  cada 6 horas segn lo necesite  Tylenol PM  Unisom, no Gelcaps  Calambres en las piernas:  Tums  MagGel Nuseas/Vmitos:  Bonine  Dramamine  Emetrol  Ginger (extracto)  Sea-Bands  Meclizine  Medicina para las nuseas que puede tomar durante el embarazo: Unisom (doxylamine succinate, pastillas de 25 mg) Tome una pastilla al da al [place] Mountainsacostarse. Si los sntomas no estn adecuadamente controlados, la dosis puede  aumentarse hasta una dosis mxima recomendada de Liberty Mutualdos pastillas al da (1/2 pastilla por la [place], 1/2 pastilla a media tarde y Neomia Dearuna pastilla al [place]acostarse). Pastillas de Vitamina B6 de 100mg . Tome ConAgra Foodsuna pastilla dos veces al da (hasta 200 mg por da).  Erupciones en la piel:  Productos de Aveeno  Benadryl cream (crema o una dosis de 25mg  cada 6 horas segn lo necesite)  Calamine Lotion (locin)  1% cortisone cream (crema de cortisona de 1%)  nfeccin vaginal por hongos (candidiasis):  Gyne-lotrimin 7  Monistat 7   **Si est tomando varias medicinas, por favor revise las etiquetas para Art gallery managerevitar duplicar los mismos ingredientes [place] Pointactivos. **Tome la medicina segn lo indicado en la etiqueta. **No tome ms de 400 mg de Tylenol en 24 horas. **No tome medicinas que contengan aspirina o ibuprofeno.

## 2014-11-11 NOTE — Progress Notes (Signed)
States had fever last night 99.2, 100.0.States head pressure.states had a cold last week.

## 2014-11-25 ENCOUNTER — Encounter: Payer: Medicaid Other | Admitting: Certified Nurse Midwife

## 2014-11-25 ENCOUNTER — Encounter: Payer: Medicaid Other | Admitting: Advanced Practice Midwife

## 2014-12-02 ENCOUNTER — Ambulatory Visit (INDEPENDENT_AMBULATORY_CARE_PROVIDER_SITE_OTHER): Payer: Medicaid Other | Admitting: Certified Nurse Midwife

## 2014-12-02 VITALS — BP 98/64 | HR 100 | Wt 242.2 lb

## 2014-12-02 DIAGNOSIS — O26843 Uterine size-date discrepancy, third trimester: Secondary | ICD-10-CM

## 2014-12-02 LAB — POCT URINALYSIS DIP (DEVICE)
Bilirubin Urine: NEGATIVE
Glucose, UA: NEGATIVE mg/dL
Ketones, ur: NEGATIVE mg/dL
Nitrite: NEGATIVE
Protein, ur: NEGATIVE mg/dL
Specific Gravity, Urine: 1.02 (ref 1.005–1.030)
Urobilinogen, UA: 0.2 mg/dL (ref 0.0–1.0)
pH: 6 (ref 5.0–8.0)

## 2014-12-02 NOTE — Progress Notes (Signed)
Discussed breast feeding tip of the week.  Moderate Leukocytes in urine.

## 2014-12-02 NOTE — Patient Instructions (Signed)

## 2014-12-02 NOTE — Progress Notes (Signed)
Subjective:  Amanda Mahoney is a 24 y.o. [phone] at 5339w4d being seen today for ongoing prenatal care.  She is currently monitored for the following issues for this high-risk pregnancy and has Late prenatal care affecting pregnancy in second trimester, antepartum; Barbiturate misuse; Chronic cholecystitis; Cholecystitis; and Anemia of mother in pregnancy, antepartum on her problem list.  Patient reports no complaints.  Contractions: Not present. Vag. Bleeding: None.  Movement: Present. Denies leaking of fluid.   The following portions of the patient's history were reviewed and updated as appropriate: allergies, current medications, past family history, past medical history, past social history, past surgical history and problem list. Problem list updated.  Objective:   Filed Vitals:   12/02/14 1009  BP: 98/64  Pulse: 100  Weight: 242 lb 3.2 oz (109.861 kg)    Fetal Status: Fetal Heart Rate (bpm): 138 Fundal Height: 37 cm Movement: Present     General:  Alert, oriented and cooperative. Patient is in no acute distress.  Skin: Skin is warm and dry. No rash noted.   Cardiovascular: Normal heart rate noted  Respiratory: Normal respiratory effort, no problems with respiration noted  Abdomen: Soft, gravid, appropriate for gestational age. Pain/Pressure: Present     Pelvic: Vag. Bleeding: None     Cervical exam deferred        Extremities: Normal range of motion.  Edema: Trace  Mental Status: Normal mood and affect. Normal behavior. Normal judgment and thought content.   Urinalysis: Urine Protein: Negative Urine Glucose: Negative  Assessment and Plan:  Pregnancy: G4P1021 at 4339w4d  1. Uterine size date discrepancy pregnancy, third trimester  - US MFM OB COMP + 14 WK; Future  Preterm labor symptoms and general obstetric precautions including but not limited to vaginal bleeding, contractions, leaking of fluid and fetal movement were reviewed in detail with the patient. Please refer to After  Visit Summary for other counseling recommendations.  Return in about 2 weeks (around 12/16/2014).   Rhea PinkLori A Luman Holway, CNM

## 2014-12-07 ENCOUNTER — Other Ambulatory Visit: Payer: Self-pay | Admitting: Certified Nurse Midwife

## 2014-12-07 ENCOUNTER — Ambulatory Visit (HOSPITAL_COMMUNITY)
Admission: RE | Admit: 2014-12-07 | Discharge: 2014-12-07 | Disposition: A | Discharge status: Home / Self Care | Payer: Medicaid Other | Source: Ambulatory Visit | Attending: Certified Nurse Midwife | Admitting: Certified Nurse Midwife

## 2014-12-07 DIAGNOSIS — O26843 Uterine size-date discrepancy, third trimester: Secondary | ICD-10-CM

## 2014-12-07 DIAGNOSIS — O99213 Obesity complicating pregnancy, third trimester: Secondary | ICD-10-CM

## 2014-12-07 DIAGNOSIS — Z3A34 34 weeks gestation of pregnancy: Secondary | ICD-10-CM

## 2014-12-07 DIAGNOSIS — K831 Obstruction of bile duct: Secondary | ICD-10-CM

## 2014-12-07 DIAGNOSIS — O0933 Supervision of pregnancy with insufficient antenatal care, third trimester: Secondary | ICD-10-CM

## 2014-12-07 DIAGNOSIS — O99323 Drug use complicating pregnancy, third trimester: Secondary | ICD-10-CM

## 2014-12-07 DIAGNOSIS — K8020 Calculus of gallbladder without cholecystitis without obstruction: Secondary | ICD-10-CM | POA: Diagnosis not present

## 2014-12-07 DIAGNOSIS — F1920 Other psychoactive substance dependence, uncomplicated: Secondary | ICD-10-CM

## 2014-12-07 DIAGNOSIS — O26613 Liver and biliary tract disorders in pregnancy, third trimester: Secondary | ICD-10-CM | POA: Diagnosis not present

## 2014-12-07 DIAGNOSIS — O26643 Intrahepatic cholestasis of pregnancy, third trimester: Secondary | ICD-10-CM

## 2014-12-07 IMAGING — US US MFM OB FOLLOW-UP
1 series · 14 of 28 positions shown · non-contrast
Comparison: none

[Series 1: us mfm ob follow-up · 63 acquisitions, 14 frames shown]
[im 3/63]
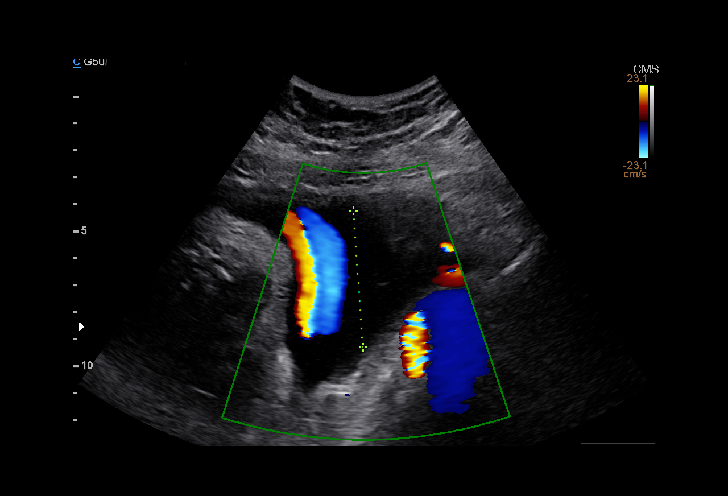
[im 7/63]
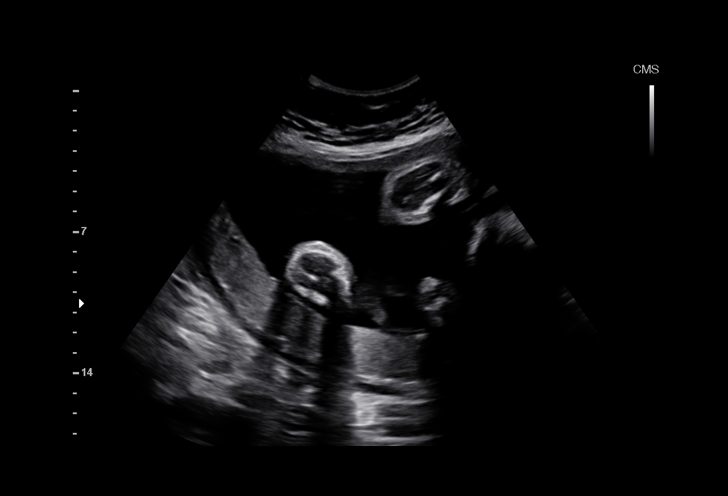
[im 12/63]
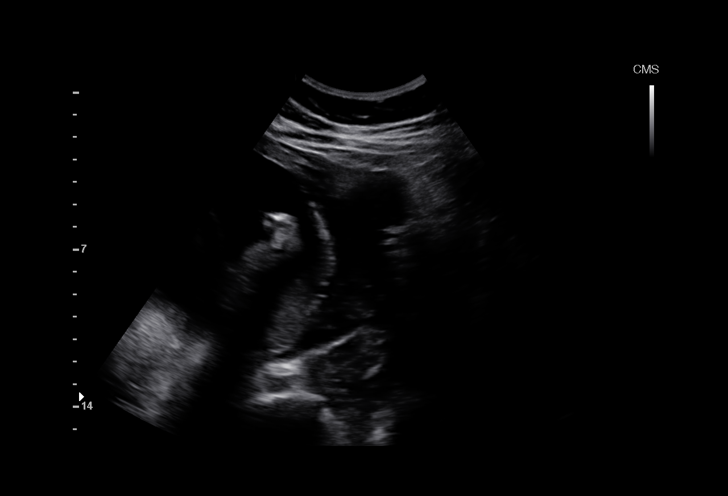
[im 17/63]
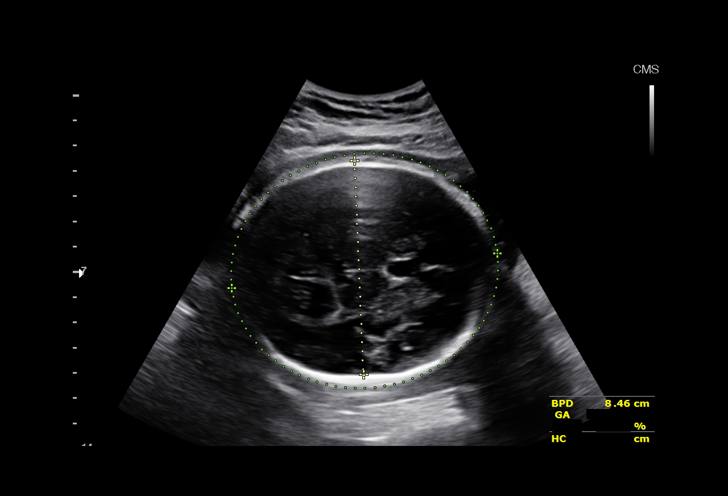
[im 21/63]
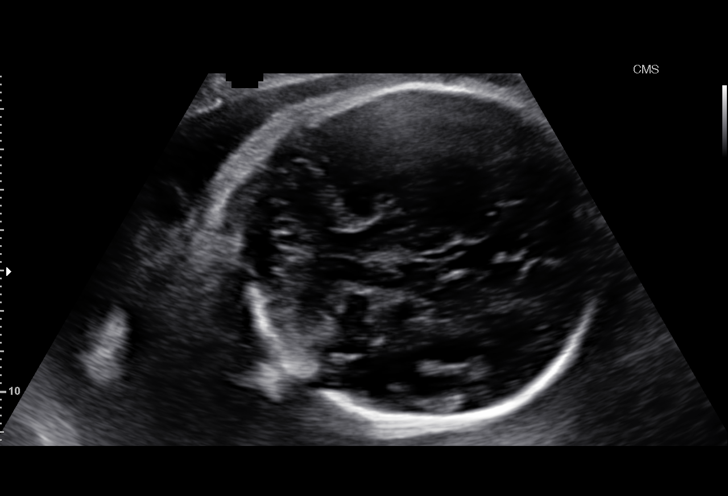
[im 26/63]
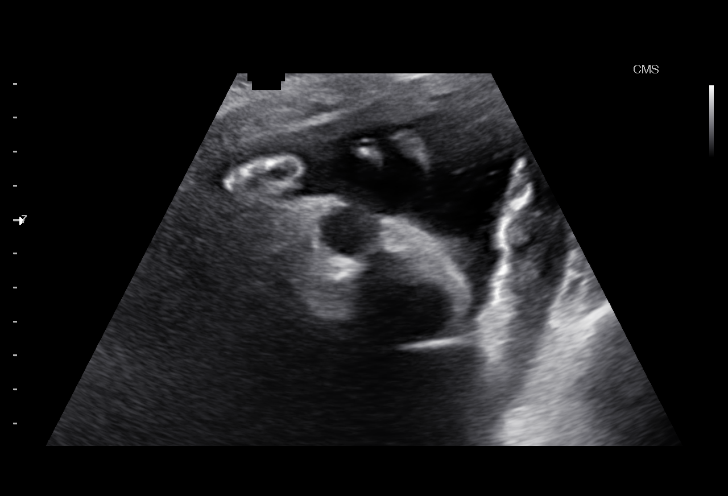
[im 30/63]
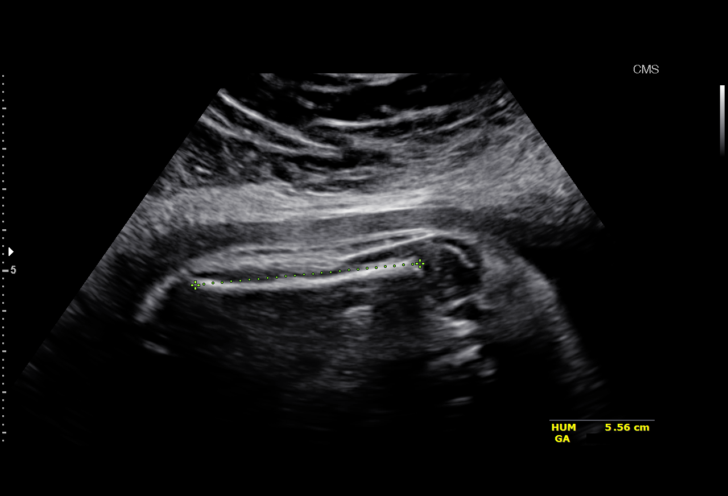
[im 35/63]
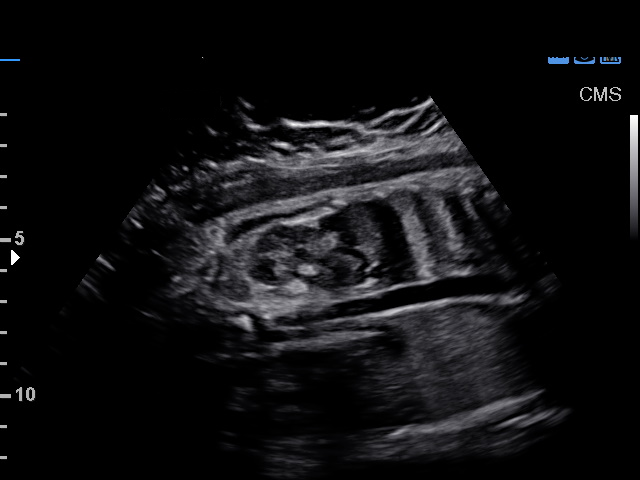
[im 40/63]
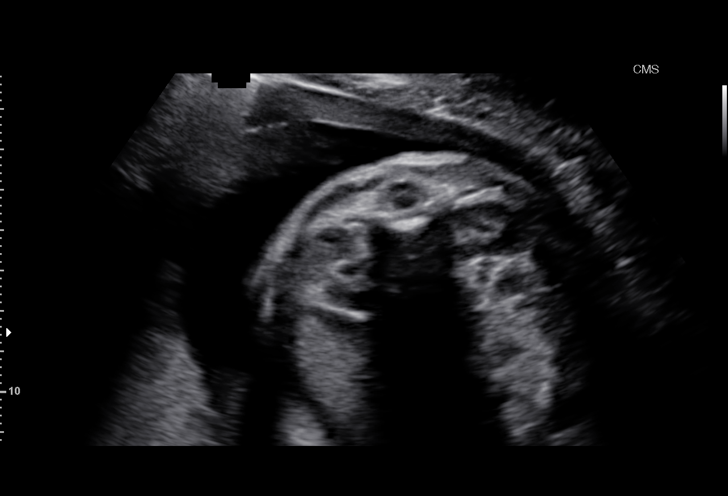
[im 44/63]
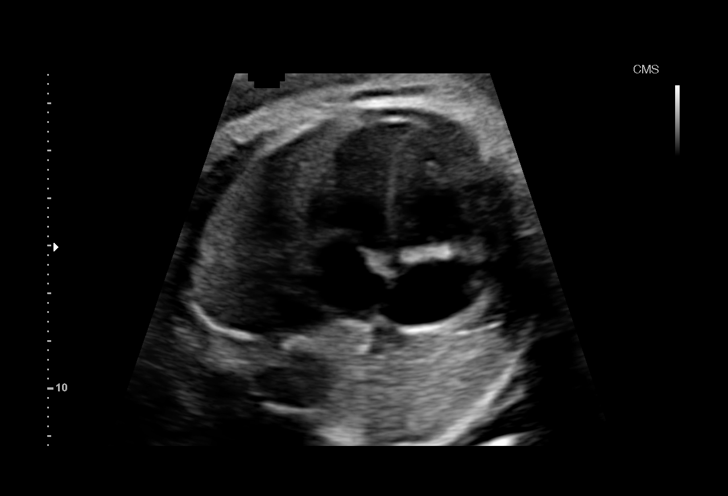
[im 49/63]
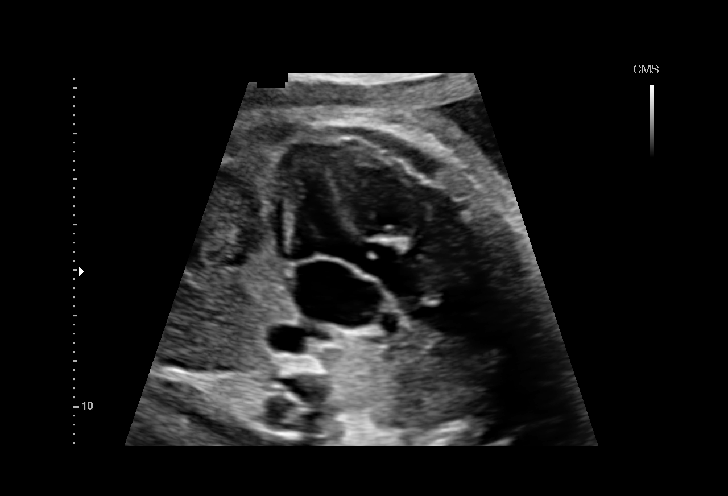
[im 53/63]
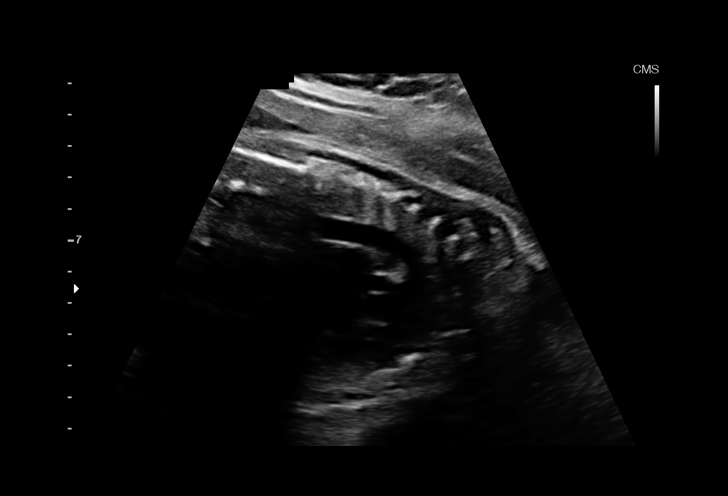
[im 58/63]
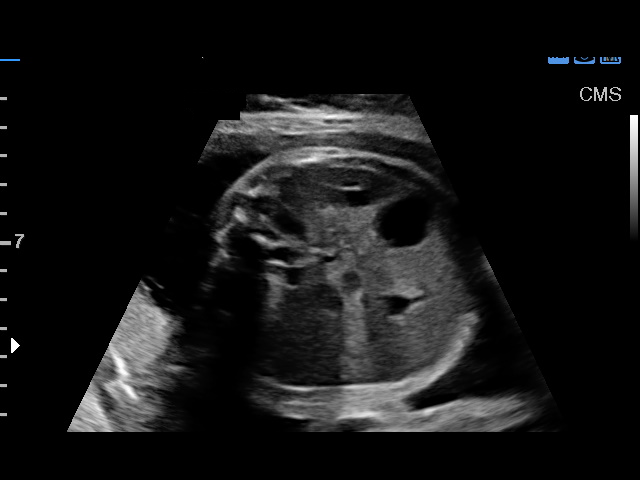
[im 63/63]
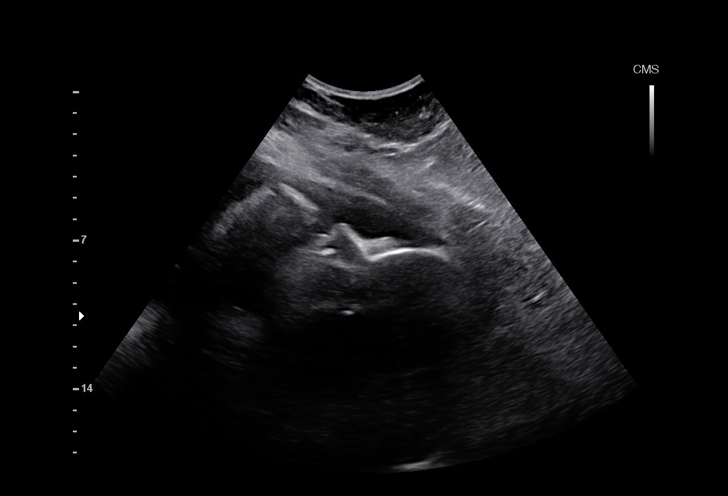

[14 of 28 positions shown; findings below may reference images not displayed]

Hospital Clinic-
Faculty Physician
OB/Gyn Clinic
[REDACTED]-
Faculty Physician
OB/Gyn Clinic

1  MERCIDITA            [PHONE_NUMBER]      [PHONE_NUMBER]     [PHONE_NUMBER]
Indications

Obesity complicating pregnancy, third          [PW]
trimester
Late Prenatal Care                             [PW]
Cholelithiasis affecting pregnancy,            O26.619, [PW]
antepartum
34 weeks gestation of pregnancy
Uterine size-date discrepancy, third trimester [PW]
(S>D)
Drug use complicating pregnancy, third         [PW]
trimester (phenobarbital)
OB History

Height:       4'11"   Weight:   231        BMI:
Gravidity:    4         Term:   1        Prem:   0         SAB:   2
TOP:          0       Ectopic:  0        Living: 1
Fetal Evaluation

Num Of Fetuses:     1
Fetal Heart         164
Rate(bpm):
Cardiac Activity:   Observed
Presentation:       Cephalic
Placenta:           Posterior, above cervical os
P. Cord Insertion:  Visualized, central

Amniotic Fluid
AFI FV:      Subjectively within normal limits
AFI Sum:     20.71    cm      78  %Tile      Larg Pckt:   6.98  cm
RUQ:   3.82    cm   RLQ:    6.98   cm    LUQ:   5.07    cm    LLQ:   4.84    cm
Biometry

BPD:      84.5  mm     G. Age:  34w 0d                  CI:         72.28  %    70 - 86
FL/HC:       20.4  %    19.4 -
HC:      316.2  mm     G. Age:  35w 4d         46  %    HC/AC:       1.04       0.96 -
AC:      304.7  mm     G. Age:  34w 3d         58  %    FL/BPD:      76.2  %    71 - 87
FL:       64.4  mm     G. Age:  33w 2d         17  %    FL/AC:       21.1  %    20 - 24
HUM:      56.2  mm     G. Age:  32w 5d         27  %

Est. FW:    [PW]   gm     5 lb 3 oz     57  %
Gestational Age

U/S Today:     34w 2d                                        EDD:    [DATE]
Best:          34w 2d     Det. By:  Early Ultrasound         EDD:    [DATE]
([DATE])
Anatomy

Cranium:          Appears normal         Aortic Arch:      Appears normal
Fetal Cavum:      Appears normal         Ductal Arch:      Appears normal
Ventricles:       Appears normal         Diaphragm:        Appears normal
Choroid Plexus:   Appears normal         Stomach:          Appears normal, left
sided
Cerebellum:       Appears normal         Abdomen:          Appears normal
Posterior Fossa:  Appears normal         Abdominal Wall:   Previously seen
Nuchal Fold:      Previously seen        Cord Vessels:     Previously seen
Face:             Orbits and profile     Kidneys:          Appear normal
previously seen
Lips:             Appears normal         Bladder:          Appears normal
Heart:            Appears normal         Spine:            Appears normal
(4CH, axis, and
situs)
RVOT:             Appears normal         Upper             Previously seen
Extremities:
LVOT:             Appears normal         Lower             Previously seen
Extremities:

Other:  Female gender previously visualized.. Heels and 5th digit previously
seen.
Cervix Uterus Adnexa

Cervix
Not visualized (advanced GA >[PW])

Uterus
No abnormality visualized.
Left Ovary
Within normal limits.

Right Ovary
Within normal limits.

Cul De Sac:   No free fluid seen.

Adnexa:       No abnormality visualized.
Impression

SIUP at 34+2 weeks
Normal interval anatomy; anatomic survey complete
Normal amniotic fluid volume
Appropriate interval growth with EFW at the 57th %tile
Recommendations

Continue twice weekly NSTs with weekly AFIs
Deliver by 37 weeks

## 2014-12-09 ENCOUNTER — Encounter (HOSPITAL_COMMUNITY): Payer: Self-pay

## 2014-12-09 ENCOUNTER — Inpatient Hospital Stay (HOSPITAL_COMMUNITY)
Admission: AD | Admit: 2014-12-09 | Discharge: 2014-12-09 | Disposition: A | Discharge status: Home / Self Care | Payer: Medicaid Other | Source: Ambulatory Visit | Attending: Obstetrics and Gynecology | Admitting: Obstetrics and Gynecology

## 2014-12-09 DIAGNOSIS — O0932 Supervision of pregnancy with insufficient antenatal care, second trimester: Secondary | ICD-10-CM

## 2014-12-09 DIAGNOSIS — O26893 Other specified pregnancy related conditions, third trimester: Secondary | ICD-10-CM | POA: Diagnosis not present

## 2014-12-09 DIAGNOSIS — M549 Dorsalgia, unspecified: Secondary | ICD-10-CM

## 2014-12-09 DIAGNOSIS — F1390 Sedative, hypnotic, or anxiolytic use, unspecified, uncomplicated: Secondary | ICD-10-CM

## 2014-12-09 DIAGNOSIS — O9989 Other specified diseases and conditions complicating pregnancy, childbirth and the puerperium: Secondary | ICD-10-CM

## 2014-12-09 DIAGNOSIS — Z3A34 34 weeks gestation of pregnancy: Secondary | ICD-10-CM | POA: Diagnosis not present

## 2014-12-09 DIAGNOSIS — F1310 Sedative, hypnotic or anxiolytic abuse, uncomplicated: Secondary | ICD-10-CM

## 2014-12-09 DIAGNOSIS — M545 Low back pain: Secondary | ICD-10-CM | POA: Diagnosis not present

## 2014-12-09 DIAGNOSIS — O99013 Anemia complicating pregnancy, third trimester: Secondary | ICD-10-CM

## 2014-12-09 DIAGNOSIS — K811 Chronic cholecystitis: Secondary | ICD-10-CM

## 2014-12-09 DIAGNOSIS — O99891 Other specified diseases and conditions complicating pregnancy: Secondary | ICD-10-CM

## 2014-12-09 DIAGNOSIS — R109 Unspecified abdominal pain: Secondary | ICD-10-CM | POA: Diagnosis present

## 2014-12-09 LAB — URINALYSIS, ROUTINE W REFLEX MICROSCOPIC
Bilirubin Urine: NEGATIVE
GLUCOSE, UA: NEGATIVE mg/dL
Hgb urine dipstick: NEGATIVE
KETONES UR: NEGATIVE mg/dL
NITRITE: NEGATIVE
PH: 6 (ref 5.0–8.0)
Protein, ur: NEGATIVE mg/dL

## 2014-12-09 LAB — URINE MICROSCOPIC-ADD ON
BACTERIA UA: NONE SEEN
RBC / HPF: NONE SEEN RBC/hpf (ref 0–5)

## 2014-12-09 MED ORDER — CYCLOBENZAPRINE HCL 10 MG PO TABS
10.0000 mg | ORAL_TABLET | Freq: Once | ORAL | Status: AC
  Administered 2014-12-09: 10 mg via ORAL
  Filled 2014-12-09: qty 1

## 2014-12-09 MED ORDER — CYCLOBENZAPRINE HCL 10 MG PO TABS: 10.0000 mg | ORAL_TABLET | Freq: Two times a day (BID) | ORAL | Status: DC | PRN

## 2014-12-09 NOTE — MAU Provider Note (Signed)
History     CSN: [id number]  Arrival date and time: 12/09/14 1242   First Provider Initiated Contact with Patient 12/09/14 1323      Chief Complaint  Patient presents with  . Abdominal Cramping   HPI Pt is [redacted]w[redacted]d G4P1021 c/o of lower back pain that started yesterday and is worse today. The pain feels like something pulling apart the middle of her back.  The pain is localized. Pt took Tylenol this morning and helped for about 1 hour. Pt's pain has worsened and has kept her up at night. Pt denies vaginal discharge, spotting or bleeding, or UTI sx.  Pt denies constipation or diarrhea at this time. Pt continues to have nausea with her pregnancy, controlled with Reglan. Baby is active Pt lives at [facility] at [facility] and patient at the [facility]   Past Medical History  Diagnosis Date  . 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 GERD (gastroesophageal reflux disease)   . Depression     11/2 YR AGO HOSPITALIZED FOR DEPRESSION IN [place]  . Currently pregnant     [redacted] WEEKS    Past Surgical History  Procedure Laterality Date  . Cholecystectomy N/A 09/15/2014    Procedure: LAPAROSCOPIC CHOLECYSTECTOMY ;  Surgeon: De Blanch Kinsinger, MD;  Location: [facility];  Service: General;  Laterality: N/A;    Family History  Problem Relation Age of Onset  . Hypertension Mother   . Hypertension Sister   . Hypertension Maternal Grandmother     Social History  Substance Use Topics  . Smoking status: Never Smoker   . Smokeless tobacco: Never Used  . Alcohol Use: No    Allergies:  Allergies  Allergen Reactions  . Latex Rash    Prescriptions prior to admission  Medication Sig Dispense Refill Last Dose  . acetaminophen (TYLENOL) 325 MG tablet Take 650 mg by mouth every 6 (six) hours as needed for headache.   Taking  . ferrous sulfate (FERROUSUL) 325 (65 FE) MG tablet Take 1 tablet (325 mg total) by mouth 2 (two) times daily. 60 tablet 1 Taking  . metoCLOPramide (REGLAN) 10 MG  tablet Take 1 tablet (10 mg total) by mouth 3 (three) times daily with meals. 90 tablet 1 Taking  . pantoprazole (PROTONIX) 20 MG tablet Take 20 mg by mouth 2 (two) times daily.   Taking  . Prenatal Multivit-Min-Fe-FA (PRENATAL VITAMINS PO) Take 2 tablets by mouth daily. Taking otc prenatal gummies.   Taking    Review of Systems  Constitutional: Negative for fever and chills.  Gastrointestinal: Positive for nausea. Negative for vomiting, abdominal pain, diarrhea and constipation.  Genitourinary: Negative for dysuria and urgency.  Musculoskeletal: Positive for back pain.   Physical Exam   Blood pressure 121/70, pulse 119, temperature 99.1 F (37.3 C), temperature source Oral, resp. rate 20, height  (1.499 m), weight 242 lb (109.77 kg).  Physical Exam  Nursing note and vitals reviewed. Constitutional: She is oriented to person, place, and time. She appears well-developed and well-nourished. No distress.  HENT:  Head: Normocephalic.  Eyes: Pupils are equal, round, and reactive to light.  Neck: Normal range of motion. Neck supple.  Cardiovascular: Normal rate.   Respiratory: Effort normal.  No CVA tenderness  GI: Soft.  occ ctx, mild uterine irritability; FHR reactive baseline 140 with 15x15 bpm accelerations.    Genitourinary:  Cervix FT-1 cm soft 50% effaced  Musculoskeletal: Normal range of motion.  Neurological: She is alert  and oriented to person, place, and time.  Skin: Skin is warm and dry.  Psychiatric: She has a normal mood and affect.    MAU Course  Procedures Flexeril 10mg  PO given for back pain Results for orders placed or performed during the hospital encounter of 12/09/14 (from the past 24 hour(s))  Urinalysis, Routine w reflex microscopic ([facility])     Status: Abnormal   Collection Time: 12/09/14  1:00 PM  Result Value Ref Range   Color, Urine YELLOW YELLOW   APPearance CLEAR CLEAR   Specific Gravity, Urine <1.005 (L) 1.005 - 1.030   pH 6.0 5.0 - 8.0    Glucose, UA NEGATIVE NEGATIVE mg/dL   Hgb urine dipstick NEGATIVE NEGATIVE   Bilirubin Urine NEGATIVE NEGATIVE   Ketones, ur NEGATIVE NEGATIVE mg/dL   Protein, ur NEGATIVE NEGATIVE mg/dL   Nitrite NEGATIVE NEGATIVE   Leukocytes, UA TRACE (A) NEGATIVE  Urine microscopic-add on     Status: Abnormal   Collection Time: 12/09/14  1:00 PM  Result Value Ref Range   Squamous Epithelial / LPF 0-5 (A) NONE SEEN   WBC, UA 0-5 0 - 5 WBC/hpf   RBC / HPF NONE SEEN 0 - 5 RBC/hpf   Bacteria, UA NONE SEEN NONE SEEN    Assessment and Plan  Back pain in pregnancy- flexeril 10mg  ; position change, comfort measures, tylenol Pregnancy 7022w4d viable IUP F/u with OB appointment at clinic as scheduled Return for any concerns  Amanda Mahoney 12/09/2014, 1:23 PM

## 2014-12-09 NOTE — MAU Note (Signed)
Started yesterday, cramping- feels like her period is going to start.  Pain also in back.

## 2014-12-15 ENCOUNTER — Other Ambulatory Visit: Payer: Self-pay | Admitting: Family

## 2014-12-16 ENCOUNTER — Ambulatory Visit (INDEPENDENT_AMBULATORY_CARE_PROVIDER_SITE_OTHER): Payer: Medicaid Other | Admitting: Family

## 2014-12-16 ENCOUNTER — Other Ambulatory Visit (HOSPITAL_COMMUNITY)
Admission: RE | Admit: 2014-12-16 | Discharge: 2014-12-16 | Disposition: A | Discharge status: Home / Self Care | Payer: Medicaid Other | Source: Ambulatory Visit | Attending: Family | Admitting: Family

## 2014-12-16 VITALS — BP 126/86 | HR 91 | Temp 98.1°F | Wt 242.2 lb

## 2014-12-16 DIAGNOSIS — D649 Anemia, unspecified: Secondary | ICD-10-CM

## 2014-12-16 DIAGNOSIS — O99013 Anemia complicating pregnancy, third trimester: Secondary | ICD-10-CM | POA: Diagnosis present

## 2014-12-16 DIAGNOSIS — Z113 Encounter for screening for infections with a predominantly sexual mode of transmission: Secondary | ICD-10-CM | POA: Diagnosis not present

## 2014-12-16 DIAGNOSIS — O0932 Supervision of pregnancy with insufficient antenatal care, second trimester: Secondary | ICD-10-CM

## 2014-12-16 LAB — POCT URINALYSIS DIP (DEVICE)
BILIRUBIN URINE: NEGATIVE
Glucose, UA: NEGATIVE mg/dL
KETONES UR: NEGATIVE mg/dL
Nitrite: NEGATIVE
PH: 7 (ref 5.0–8.0)
Protein, ur: NEGATIVE mg/dL
SPECIFIC GRAVITY, URINE: 1.02 (ref 1.005–1.030)
Urobilinogen, UA: 1 mg/dL (ref 0.0–1.0)

## 2014-12-16 LAB — OB RESULTS CONSOLE GC/CHLAMYDIA: GC PROBE AMP, GENITAL: NEGATIVE

## 2014-12-16 LAB — OB RESULTS CONSOLE GBS: STREP GROUP B AG: POSITIVE

## 2014-12-16 NOTE — Progress Notes (Signed)
Edema- feet  36 wk lab today

## 2014-12-16 NOTE — Progress Notes (Signed)
Subjective:  Amanda Mahoney is a 24 y.o. [phone] at 6956w4d being seen today for ongoing prenatal care.  She is currently monitored for the following issues for this high-risk pregnancy and has late prenatal care affecting pregnancy in second trimester, antepartum; Barbiturate misuse; Chronic cholecystitis; Cholecystitis; and Anemia of mother in pregnancy, antepartum on her problem list.  Patient reports no complaints.  Contractions: Not present. Vag. Bleeding: None.  Movement: Present. Denies leaking of fluid.   The following portions of the patient's history were reviewed and updated as appropriate: allergies, current medications, past family history, past medical history, past social history, past surgical history and problem list. Problem list updated.  Objective:   Filed Vitals:   12/16/14 1018  BP: 126/86  Pulse: 91  Temp: 98.1 F (36.7 C)  Weight: 242 lb 3.2 oz (109.861 kg)    Fetal Status: Fetal Heart Rate (bpm): 127 Fundal Height: 39 cm Movement: Present  Presentation: Vertex  General:  Alert, oriented and cooperative. Patient is in no acute distress.  Skin: Skin is warm and dry. No rash noted.   Cardiovascular: Normal heart rate noted  Respiratory: Normal respiratory effort, no problems with respiration noted  Abdomen: Soft, gravid, appropriate for gestational age. Pain/Pressure: Present     Pelvic: Vag. Bleeding: None     Cervical exam performed Dilation: 1.5 Effacement (%): 50 Station: -3  Extremities: Normal range of motion.  Edema: Trace  Mental Status: Normal mood and affect. Normal behavior. Normal judgment and thought content.   Urinalysis: Urine Protein: Negative Urine Glucose: Negative  Assessment and Plan:  Pregnancy: G4P1021 at 8756w4d  1. Anemia of mother in pregnancy, antepartum, third trimester - Culture, beta strep (group b only) - GC/Chlamydia probe amp ([place]  2. Late prenatal care affecting pregnancy in second trimester, antepartum -  Continue monitoring  Preterm labor symptoms and general obstetric precautions including but not limited to vaginal bleeding, contractions, leaking of fluid and fetal movement were reviewed in detail with the patient. Please refer to After Visit Summary for other counseling recommendations.  Return in about 1 week (around 12/23/2014).   Eino FarberWalidah Kennith GainN Mahoney, CNM

## 2014-12-17 LAB — GC/CHLAMYDIA PROBE AMP (~~LOC~~) NOT AT ARMC
Chlamydia: NEGATIVE
Neisseria Gonorrhea: NEGATIVE

## 2014-12-18 LAB — CULTURE, BETA STREP (GROUP B ONLY)

## 2014-12-22 ENCOUNTER — Ambulatory Visit (INDEPENDENT_AMBULATORY_CARE_PROVIDER_SITE_OTHER): Payer: Medicaid Other | Admitting: Student

## 2014-12-22 ENCOUNTER — Encounter: Payer: Self-pay | Admitting: Student

## 2014-12-22 VITALS — BP 107/79 | HR 123 | Temp 98.6°F | Wt 241.0 lb

## 2014-12-22 DIAGNOSIS — Z9049 Acquired absence of other specified parts of digestive tract: Secondary | ICD-10-CM | POA: Insufficient documentation

## 2014-12-22 DIAGNOSIS — J069 Acute upper respiratory infection, unspecified: Secondary | ICD-10-CM

## 2014-12-22 DIAGNOSIS — O0932 Supervision of pregnancy with insufficient antenatal care, second trimester: Secondary | ICD-10-CM | POA: Diagnosis not present

## 2014-12-22 LAB — POCT URINALYSIS DIP (DEVICE)
BILIRUBIN URINE: NEGATIVE
Glucose, UA: 100 mg/dL — AB
Ketones, ur: NEGATIVE mg/dL
NITRITE: NEGATIVE
PH: 6 (ref 5.0–8.0)
Protein, ur: NEGATIVE mg/dL
Specific Gravity, Urine: 1.02 (ref 1.005–1.030)
UROBILINOGEN UA: 1 mg/dL (ref 0.0–1.0)

## 2014-12-22 NOTE — Progress Notes (Signed)
C/o getting a cold. Oxygen saturation 98%

## 2014-12-22 NOTE — Progress Notes (Signed)
Subjective:  Amanda Mahoney is a 24 y.o. [phone] at 7663w3d being seen today for ongoing prenatal care.  She is currently monitored for the following issues for this low-risk pregnancy and has Late prenatal care affecting pregnancy in second trimester, antepartum; Barbiturate misuse; Chronic cholecystitis; Anemia of mother in pregnancy, antepartum; and Hx laparoscopic cholecystectomy on her problem list.  Patient reports sore throat, post nasal drip, & cough.  Denies fever, headache, sinus pain, or ear pain.  Contractions: Irregular. Vag. Bleeding: None.  Movement: Present. Denies leaking of fluid.   The following portions of the patient's history were reviewed and updated as appropriate: allergies, current medications, past family history, past medical history, past social history, past surgical history and problem list. Problem list updated.  Objective:   Filed Vitals:   12/22/14 1018  BP: 107/79  Pulse: 123  Temp: 98.6 F (37 C)  Weight: 241 lb (109.317 kg)    Fetal Status: Fetal Heart Rate (bpm): 154   Movement: Present     General:  Alert, oriented and cooperative. Patient is in no acute distress.  Skin: Skin is warm and dry. No rash noted.   Cardiovascular: Normal heart rate noted  Respiratory: Normal respiratory effort, no problems with respiration noted  Abdomen: Soft, gravid, appropriate for gestational age. Pain/Pressure: Present     Pelvic: Vag. Bleeding: None     Cervical exam deferred        Extremities: Normal range of motion.  Edema: Trace  Mental Status: Normal mood and affect. Normal behavior. Normal judgment and thought content.   Urinalysis: Urine Protein: Negative Urine Glucose: 1+  Assessment and Plan:  Pregnancy: G4P1021 at 3263w3d  1. Late prenatal care affecting pregnancy in second trimester, antepartum   2. URI (upper respiratory infection)  -take zyrtec & robitussin OTC per package instructions.  -List of OTC meds safe in pregnancy given   Term labor  symptoms and general obstetric precautions including but not limited to vaginal bleeding, contractions, leaking of fluid and fetal movement were reviewed in detail with the patient. Please refer to After Visit Summary for other counseling recommendations.  Return in about 1 week (around 12/29/2014).   Judeth HornErin Azekiel Cremer, NP

## 2014-12-22 NOTE — Patient Instructions (Signed)
Las medicinas seguras para tomar Academic librarian  Safe Medications in Pregnancy  Acn:  Benzoyl Peroxide (Perxido de benzolo)  Salicylic Acid (cido saliclico)  Dolor de espalda/Dolor de cabeza:  Tylenol: 2 pastillas de concentracin regular cada 4 horas O 2 pastillas de concentracin fuerte cada 6 horas  Resfriados/Tos/Alergias:  Benadryl (sin alcohol) 25 mg cada 6 horas segn lo necesite Breath Right strips (Tiras para respirar correctamente)  Claritin  Cepacol (pastillas de chupar para la garganta)  Chloraseptic (aerosol para la garganta)  Cold-Eeze- hasta tres veces por da  Cough drops (pastillas de chupar para la tos, sin alcohol)  Flonase (con receta mdica solamente)  Guaifenesin  Mucinex  Robitussin DM (simple solamente, sin alcohol)  Saline nasal spray/drops (Aerosol nasal salino/gotas) Sudafed (pseudoephedrine) y  Actifed * utilizar slo despus de 12 semanas de gestacin y si no tiene la presin arterial alta.  Tylenol Vicks  VapoRub  Zinc lozenges (pastillas para la garganta)  Zyrtec  Estreimiento:  Colace  Ducolax (supositorios)  Fleet enema (lavado intestinal rectal)  Glycerin (supositorios)  Metamucil  Milk of magnesia (leche de magnesia)  Miralax  Senokot  Smooth Move (t)  Diarrea:  Kaopectate Imodium A-D  *NO tome Pepto-Bismol  Hemorroides:  Anusol  Anusol HC  Preparation H  Tucks  Indigestin:  Tums  Maalox  Mylanta  Zantac  Pepcid  Insomnia:  Benadryl (sin alcohol) 25mg  cada 6 horas segn lo necesite  Tylenol PM  Unisom, no Gelcaps  Calambres en las piernas:  Tums  MagGel Nuseas/Vmitos:  Bonine  Dramamine  Emetrol  Ginger (extracto)  Sea-Bands  Meclizine  Medicina para las nuseas que puede tomar durante el embarazo: Unisom (doxylamine succinate, pastillas de 25 mg) Tome una pastilla al da al [place]. Si los sntomas no estn adecuadamente controlados, la dosis puede aumentarse hasta una dosis mxima recomendada de  [employer] al da (1/2 pastilla por la [place], 1/2 pastilla a media tarde y Neomia Dear pastilla al [place]). Pastillas de Vitamina B6 de 100mg . Tome [employer] veces al da (hasta 200 mg por da).  Erupciones en la piel:  Productos de Aveeno  Benadryl cream (crema o una dosis de 25mg  cada 6 horas segn lo necesite)  Calamine Lotion (locin)  1% cortisone cream (crema de cortisona de 1%)  nfeccin vaginal por hongos (candidiasis):  Gyne-lotrimin 7  Monistat 7   **Si est tomando varias medicinas, por favor revise las etiquetas para Art gallery manager los mismos ingredientes [place]. **Tome la medicina segn lo indicado en la etiqueta. **No tome ms de 400 mg de Tylenol en 24 horas. **No tome medicinas que contengan aspirina o ibuprofeno.        Infeccin del tracto respiratorio superior, adultos (Upper Respiratory Infection, Adult) La mayora de las infecciones del tracto respiratorio superior son infecciones virales de las vas que llevan el aire a los pulmones. Un infeccin del tracto respiratorio superior afecta la nariz, la garganta y las vas respiratorias superiores. El tipo ms frecuente de infeccin del tracto respiratorio superior es la nasofaringitis, que habitualmente se conoce como "resfro comn". Las infecciones del tracto respiratorio superior siguen su curso y por lo general se curan solas. En la [employer], la infeccin del tracto respiratorio superior no requiere atencin [place], Biomedical engineer a veces, despus de una infeccin viral, puede surgir una infeccin bacteriana en las vas respiratorias superiores. Esto se conoce como infeccin secundaria. Las infecciones sinusales y en el odo medio son tipos frecuentes de infecciones secundarias en el tracto  respiratorio superior. La neumona bacteriana tambin puede complicar un cuadro de infeccin del tracto respiratorio superior. Este tipo de infeccin puede empeorar el asma y la enfermedad pulmonar obstructiva crnica (EPOC).  En algunos casos, estas complicaciones pueden requerir atencin mdica de emergencia y poner en peligro la vida.  CAUSAS Casi todas las infecciones del tracto respiratorio superior se deben a los virus. Un virus es un tipo de microbio que puede contagiarse de Neomia Dear persona a Educational psychologist.  FACTORES DE RIESGO Puede estar en riesgo de sufrir una infeccin del tracto respiratorio superior si:   Fuma.  Tiene una enfermedad pulmonar o cardaca crnica.  Tiene debilitado el sistema de defensa (inmunitario) del cuerpo.  Es [address] o de edad muy [place].  Tiene asma o alergias nasales.  Trabaja en reas donde hay mucha gente o poca ventilacin.  Rudi Coco en una escuela o en un centro de atencin mdica. SIGNOS Y SNTOMAS  Habitualmente, los sntomas aparecen de 2a 3das despus de entrar en contacto con el virus del resfro. La mayora de las infecciones virales en el tracto respiratorio superior duran de 7a 10das. Sin embargo, las infecciones virales en el tracto respiratorio superior a causa del virus de la gripe pueden durar de 14a 18das y, habitualmente, son ms graves. Entre los sntomas se pueden incluir los siguientes:   Secrecin o congestin nasal.  Estornudos.  Tos.  Dolor de Advertising copywriter.  Dolor de [place].  Fatiga.  Grant Ruts.  Prdida del apetito.  Dolor en la frente, detrs de los ojos y por encima de los pmulos (dolor sinusal).  Dolores musculares. DIAGNSTICO  El mdico puede diagnosticar una infeccin del tracto respiratorio superior mediante los siguientes estudios:  Examen fsico.  Pruebas para verificar si los sntomas no se deben a otra afeccin, por ejemplo:  Faringitis estreptoccica.  Sinusitis.  Neumona.  Asma. TRATAMIENTO  Esta infeccin desaparece sola, con el tiempo. No puede curarse con medicamentos, pero a menudo se prescriben para aliviar los sntomas. Los medicamentos pueden ser tiles para lo siguiente:  Personal assistant fiebre.  Reducir la  tos.  Aliviar la congestin nasal. INSTRUCCIONES PARA EL CUIDADO EN EL HOGAR   Tome los medicamentos solamente como se lo haya indicado el mdico.  A fin de Engineer, materials de garganta, haga grgaras con solucin salina templada o consuma caramelos para la tos, como se lo haya indicado el mdico.  Use un humidificador de vapor clido o inhale el vapor de la ducha para aumentar la humedad del aire. Esto facilitar la respiracin.  Beba suficiente lquido para Photographer orina clara o de color amarillo plido.  Consuma sopas y otros caldos transparentes, y [employer].  Descanse todo lo que sea necesario.  Regrese al Aleen Campi cuando la temperatura se le haya normalizado o cuando el mdico lo autorice. Es posible que deba quedarse en su casa durante un tiempo prolongado, para no infectar a los dems. Tambin puede usar un barbijo y lavarse las manos con cuidado para Transport planner propagacin del virus.  Aumente el uso del inhalador si tiene asma.  No consuma ningn producto que contenga tabaco, lo que incluye cigarrillos, tabaco de Theatre manager o Administrator, Civil Service. Si necesita ayuda para dejar de fumar, consulte al [employer]. PREVENCIN  La mejor manera de protegerse de un resfro es mantener una higiene [place].   Evite el contacto oral o fsico con personas que tengan sntomas de resfro.  En caso de contacto, lvese las manos con frecuencia. No hay pruebas claras de que la vitaminaC,  la vitaminaE, la equincea o el ejercicio reduzcan la probabilidad de Primary school teacher un resfro. Sin embargo, siempre se recomienda Insurance account manager, hacer ejercicio y Engineering geologist.  SOLICITE ATENCIN MDICA SI:   Su estado empeora en lugar de mejorar.  Los medicamentos no Estate agent.  Tiene escalofros.  La sensacin de falta de aire empeora.  Tiene mucosidad marrn o roja.  Tiene secrecin nasal amarilla o marrn.  Le duele la cara, especialmente al inclinarse hacia  adelante.  Tiene fiebre.  Tiene los ganglios del cuello hinchados.  Siente dolor al tragar.  Tiene zonas blancas en la parte de atrs de la garganta. SOLICITE ATENCIN MDICA DE INMEDIATO SI:   Tiene sntomas intensos o persistentes de:  Dolor de [place].  Dolor de odos.  Dolor sinusal.  Dolor en el pecho.  Tiene enfermedad pulmonar crnica y cualquiera de estos sntomas:  Sibilancias.  Tos prolongada.  Tos con sangre.  Cambio en la mucosidad habitual.  Presenta rigidez en el cuello.  Tiene cambios en:  La visin.  La audicin.  El pensamiento.  El [place] de nimo. ASEGRESE DE QUE:   Comprende estas instrucciones.  Controlar su afeccin.  Recibir ayuda de inmediato si no mejora o si empeora.   Esta informacin no tiene Theme park manager el consejo del mdico. Asegrese de hacerle al mdico cualquier pregunta que tenga.   Document Released: 09/28/2004 Document Revised: 05/05/2014 Elsevier Interactive Patient Education 2016 [employer]. [employer] de Designer, multimedia (Braxton Hicks Contractions) Durante el [place], pueden presentarse contracciones uterinas que no siempre indican que est en [place].  QU SON LAS CONTRACCIONES DE BRAXTON HICKS?  Las [employer] se presentan antes del [place] de [place] se conocen como contracciones de [place] o falso trabajo de [place]. Hacia el final del embarazo (32 a 34semanas), estas contracciones pueden aparecen con ms frecuencia y volverse ms intensas. No corresponden al Aleen Campi de parto verdadero porque estas contracciones no producen el agrandamiento (la dilatacin) y el afinamiento del cuello del tero. Algunas veces, es difcil distinguirlas del trabajo de parto verdadero porque en algunos casos pueden ser [employer], y las personas tienen diferentes niveles de tolerancia al [employer]. No debe sentirse avergonzada si concurre al hospital con falso trabajo de [place]. En ocasiones, la nica  forma de saber si el trabajo de parto es verdadero es que el mdico determine si hay cambios en el cuello del tero. Si no hay problemas prenatales u otras complicaciones de salud asociadas con el embarazo, no habr inconvenientes si la envan a su casa con falso trabajo de parto y espera que comience el verdadero. CMO DIFERENCIAR EL TRABAJO DE PARTO FALSO DEL VERDADERO Falso trabajo de parto  Las contracciones del falso trabajo de parto duran menos y no son tan intensas como las verdaderas.  Generalmente son irregulares.  A menudo, se sienten en la parte delantera de la parte baja del abdomen y en la ingle,  y pueden desaparecer cuando camina o cambia de posicin mientras est acostada.  Las contracciones se vuelven ms dbiles y su duracin es Adult nurse a medida que el tiempo transcurre.  Por lo general, no se hacen progresivamente ms intensas, regulares y Herbalist entre s como en el caso del [place] de parto verdadero. Theodis Blaze de parto  Las contracciones del verdadero trabajo de parto duran de 30 a 70segundos, son muy regulares y suelen volverse ms intensas, y [place] su frecuencia.  No desaparecen cuando camina.  La molestia generalmente se siente en la  parte superior del tero y se extiende hacia la zona inferior del abdomen y [employer] cintura.  El mdico podr examinarla para determinar si el trabajo de parto es verdadero. El examen mostrar si el cuello del tero se est dilatando y [place]. LO QUE DEBE RECORDAR  Contine haciendo los ejercicios habituales y siga otras indicaciones que el mdico le d.  Tome todos los medicamentos como le indic el mdico.  Oceanographer a las visitas prenatales regulares.  Coma y beba con moderacin si cree que est en trabajo de parto.  Si las contracciones de [employer] provocan incomodidad:  Cambie de posicin: si est acostada o descansando, camine; si est caminando, descanse.  Sintese y descanse en una baera con  agua tibia.  Beba 2 o 3vasos de [place]. La deshidratacin puede provocar contracciones.  Respire lenta y profundamente varias veces por hora. CUNDO DEBO BUSCAR ASISTENCIA MDICA INMEDIATA? Solicite atencin mdica de inmediato si:  Las contracciones se intensifican, se hacen ms regulares y Arboriculturist s.  Tiene una prdida de lquido por la vagina.  Tiene fiebre.  Elimina mucosidad manchada con [place].  Tiene una hemorragia vaginal abundante.  Tiene dolor abdominal permanente.  Tiene un dolor en la zona lumbar que nunca tuvo antes.  Siente que la cabeza del beb empuja hacia abajo y ejerce presin en la zona plvica.  El beb no se mueve Dentist.   Esta informacin no tiene Theme park manager el consejo del mdico. Asegrese de hacerle al mdico cualquier pregunta que tenga.   Document Released: 09/28/2004 Document Revised: 12/24/2012 Elsevier Interactive Patient Education 2016 [employer]. Evaluacin de los movimientos fetales  (Fetal Movement Counts) Nombre del paciente: __________________________________________________ Micheline Chapman estimada: ____________________ [name] de los movimientos fetales es muy recomendable en los embarazos de alto riesgo, pero tambin es una buena idea que lo hagan todas las [place]. El Firefighter que comience a contarlos a las 28 semanas de [place]. Los movimientos fetales suelen aumentar:   Despus de Animator.  Despus de la actividad fsica.  Despus de comer o beber [employer] o fro.  En reposo. Preste atencin cuando sienta que el beb est ms activo. Esto le ayudar a notar un patrn de ciclos de vigilia y sueo de su beb y cules son los factores que contribuyen a un aumento de los movimientos fetales. Es importante llevar a cabo un recuento de movimientos fetales, al mismo tiempo cada da, cuando el beb normalmente est ms activo.  CMO CONTAR LOS MOVIMIENTOS FETALES  Busque un  lugar tranquilo y cmodo para sentarse o recostarse sobre el lado izquierdo. Al recostarse sobre su lado izquierdo, le proporciona una mejor circulacin de [place] y oxgeno al beb.  Anote el da y la hora en una hoja de papel o en un diario.  Comience contando las pataditas, revoloteos, chasquidos, vueltas o pinchazos en un perodo de 2 horas. Debe sentir al menos 10 movimientos en 2 horas.  Si no siente 10 movimientos en 2 horas, espere 2  3 horas y cuente de nuevo. Busque cambios en el patrn o si no cuenta lo suficiente en 2 horas. SOLICITE ATENCIN MDICA SI:   Siente menos de 10 pataditas en 2 horas, en dos intentos.  No hay movimientos durante una hora.  El patrn se modifica o le lleva ms tiempo Art gallery manager las 10 pataditas.  Siente que el beb no se mueve como lo hace habitualmente. Fecha: ____________ Movimientos: ____________ [name]  de inicio: ____________ [name] finalizacin: ____________  [name]: ____________ Movimientos: ____________ [name] inicio: ____________ [name] finalizacin: ____________  [name]: ____________ Movimientos: ____________ [name] inicio: ____________ [name] finalizacin: ____________  [name]: ____________ Movimientos: ____________ [name] inicio: ____________ [name] finalizacin: ____________  [name]: ____________ Movimientos: ____________ [name] inicio: ____________ [name] finalizacin: ____________  [name]: ____________ Movimientos: ____________ [name] inicio: ____________ [name] de finalizacin: ____________  [name]: ____________ Movimientos: ____________ [name] de inicio: ____________ [name] de finalizacin: ____________  [name]: ____________ Movimientos: ____________ [name] de inicio: ____________ [name] de finalizacin: ____________  [name]: ____________ Movimientos: ____________ [name] de inicio: ____________ [name] de finalizacin: ____________  [name]: ____________ Movimientos: ____________ [name] de inicio: ____________ [name] de finalizacin: ____________   [name]: ____________ Movimientos: ____________ [name] de inicio: ____________ [name] de finalizacin: ____________  [name]: ____________ Movimientos: ____________ [name] de inicio: ____________ [name] de finalizacin: ____________  [name]: ____________ Movimientos: ____________ [name] de inicio: ____________ [name] de finalizacin: ____________  [name]: ____________ Movimientos: ____________ [name] de inicio: ____________ [name] de finalizacin: ____________  [name]: ____________ Movimientos: ____________ [name] de inicio: ____________ [name] de finalizacin: ____________  [name]: ____________ Movimientos: ____________ [name] de inicio: ____________ [name] de finalizacin: ____________  [name]: ____________ Movimientos: ____________ [name] de inicio: ____________ [name] de finalizacin: ____________  [name]: ____________ Movimientos: ____________ [name] de inicio: ____________ [name] de finalizacin: ____________  [name]: ____________ Movimientos: ____________ [name] de inicio: ____________ [name] de finalizacin: ____________  [name]: ____________ Movimientos: ____________ [name] de inicio: ____________ [name] de finalizacin: ____________  [name]: ____________ Movimientos: ____________ [name] de inicio: ____________ [name] de finalizacin: ____________  [name]: ____________ Movimientos: ____________ [name] de inicio: ____________ [name] de finalizacin: ____________  [name]: ____________ Movimientos: ____________ [name] de inicio: ____________ [name] de finalizacin: ____________  [name]: ____________ Movimientos: ____________ [name] de inicio: ____________ [name] de finalizacin: ____________  [name]: ____________ Movimientos: ____________ [name] de inicio: ____________ [name] de finalizacin: ____________  [name]: ____________ Movimientos: ____________ [name] de inicio: ____________ [name] de finalizacin: ____________  [name]: ____________ Movimientos: ____________ [name] de inicio: ____________ [name] de finalizacin: ____________  [name]: ____________ Movimientos:  ____________ [name] de inicio: ____________ [name] de finalizacin: ____________  [name]: ____________ Movimientos: ____________ [name] de inicio: ____________ [name] de finalizacin: ____________  [name]: ____________ Movimientos: ____________ [name] de inicio: ____________ [name] de finalizacin: ____________  [name]: ____________ Movimientos: ____________ [name] de inicio: ____________ [name] de finalizacin: ____________  [name]: ____________ Movimientos: ____________ [name] de inicio: ____________ [name] de finalizacin: ____________  [name]: ____________ Movimientos: ____________ [name] de inicio: ____________ [name] de finalizacin: ____________  [name]: ____________ Movimientos: ____________ [name] de inicio: ____________ [name] de finalizacin: ____________  [name]: ____________ Movimientos: ____________ [name] de inicio: ____________ [name] de finalizacin: ____________  [name]: ____________ Movimientos: ____________ [name] de inicio: ____________ [name] de finalizacin: ____________  [name]: ____________ Movimientos: ____________ [name] de inicio: ____________ [name] de finalizacin: ____________  [name]: ____________ Movimientos: ____________ [name] de inicio: ____________ [name] de finalizacin: ____________  [name]: ____________ Movimientos: ____________ [name] de inicio: ____________ [name] de finalizacin: ____________  [name]: ____________ Movimientos: ____________ [name] de inicio: ____________ [name] de finalizacin: ____________  [name]: ____________ Movimientos: ____________ [name] de inicio: ____________ [name] de finalizacin: ____________  [name]: ____________ Movimientos: ____________ [name] de inicio: ____________ [name] de finalizacin: ____________  [name]: ____________ Movimientos: ____________ [name] de inicio: ____________ [name] de finalizacin: ____________  [name]: ____________ Movimientos: ____________ [name] de inicio: ____________ [name] de finalizacin: ____________  [name]: ____________ Movimientos: ____________ [name] de inicio: ____________  [name] de finalizacin: ____________  [name]: ____________ Movimientos: ____________ [name] de inicio: ____________ [name] de finalizacin: ____________  [name]: ____________ Movimientos: ____________  Hora de inicio: ____________ Mammie RussianHora de finalizacin: ____________  Franco NonesFecha: ____________ Movimientos: ____________ Stevan BornHora de inicio: ____________ Stevan BornHora de finalizacin: ____________  Franco NonesFecha: ____________ Movimientos: ____________ Stevan BornHora de inicio: ____________ Stevan BornHora de finalizacin: ____________  Franco NonesFecha: ____________ Movimientos: ____________ Stevan BornHora de inicio: ____________ Stevan BornHora de finalizacin: ____________  Franco NonesFecha: ____________ Movimientos: ____________ Stevan BornHora de inicio: ____________ Stevan BornHora de finalizacin: ____________  Franco NonesFecha: ____________ Movimientos: ____________ Stevan BornHora de inicio: ____________ Mammie RussianHora de finalizacin: ____________  Franco NonesFecha: ____________ Movimientos: ____________ Stevan BornHora de inicio: ____________ Mammie RussianHora de finalizacin: ____________  Franco NonesFecha: ____________ Movimientos: ____________ Stevan BornHora de inicio: ____________ Stevan BornHora de finalizacin: ____________  Franco NonesFecha: ____________ Movimientos: ____________ Stevan BornHora de inicio: ____________ Stevan BornHora de finalizacin: ____________  Franco NonesFecha: ____________ Movimientos: ____________ Stevan BornHora de inicio: ____________ Mammie RussianHora de finalizacin: ____________    Esta informacin no tiene como fin reemplazar el consejo del mdico. Asegrese de hacerle al mdico cualquier pregunta que tenga.   Document Released: 03/28/2007 Document Revised: 12/06/2011 Elsevier Interactive Patient Education [employer]2016 Elsevier Inc.

## 2014-12-30 ENCOUNTER — Encounter: Payer: Self-pay | Admitting: Certified Nurse Midwife

## 2014-12-30 ENCOUNTER — Ambulatory Visit (INDEPENDENT_AMBULATORY_CARE_PROVIDER_SITE_OTHER): Payer: Medicaid Other | Admitting: Certified Nurse Midwife

## 2014-12-30 VITALS — BP 105/78 | HR 90 | Temp 97.8°F | Wt 247.4 lb

## 2014-12-30 DIAGNOSIS — O0932 Supervision of pregnancy with insufficient antenatal care, second trimester: Secondary | ICD-10-CM

## 2014-12-30 DIAGNOSIS — O99323 Drug use complicating pregnancy, third trimester: Secondary | ICD-10-CM

## 2014-12-30 DIAGNOSIS — O0933 Supervision of pregnancy with insufficient antenatal care, third trimester: Secondary | ICD-10-CM

## 2014-12-30 DIAGNOSIS — F1390 Sedative, hypnotic, or anxiolytic use, unspecified, uncomplicated: Secondary | ICD-10-CM

## 2014-12-30 DIAGNOSIS — F1310 Sedative, hypnotic or anxiolytic abuse, uncomplicated: Principal | ICD-10-CM

## 2014-12-30 DIAGNOSIS — F1210 Cannabis abuse, uncomplicated: Secondary | ICD-10-CM

## 2014-12-30 LAB — POCT URINALYSIS DIP (DEVICE)
Bilirubin Urine: NEGATIVE
Glucose, UA: NEGATIVE mg/dL
Ketones, ur: 15 mg/dL — AB
Nitrite: NEGATIVE
Protein, ur: NEGATIVE mg/dL
Specific Gravity, Urine: 1.025 (ref 1.005–1.030)
Urobilinogen, UA: 1 mg/dL (ref 0.0–1.0)
pH: 5 (ref 5.0–8.0)

## 2014-12-30 MED ORDER — METOCLOPRAMIDE HCL 10 MG PO TABS: 10.0000 mg | ORAL_TABLET | Freq: Three times a day (TID) | ORAL | Status: DC

## 2014-12-30 MED ORDER — CYCLOBENZAPRINE HCL 10 MG PO TABS
10.0000 mg | ORAL_TABLET | Freq: Two times a day (BID) | ORAL | Status: DC | PRN
Start: 2014-12-30 — End: 2015-01-11

## 2014-12-30 NOTE — Patient Instructions (Signed)
Group B streptococcus (GBS) is a type of bacteria often found in healthy women. GBS is not the same as the bacteria that causes strep throat. You may have GBS in your vagina, rectum, or bladder. GBS does not spread through sexual contact, but it can be passed to a baby during childbirth. This can be dangerous for your baby. It is not dangerous to you and usually does not cause any symptoms. Your health care provider may test you for GBS when your pregnancy is between 35 and 37 weeks. GBS is dangerous only during birth, so there is no need to test for it earlier. It is possible to have GBS during pregnancy and never pass it to your baby. If your test results are positive for GBS, your health care provider may recommend giving you antibiotic medicine during delivery to make sure your baby stays healthy. RISK FACTORS You are more likely to pass GBS to your baby if:   Your water breaks (ruptured membrane) or you go into labor before 37 weeks.  Your water breaks 18 hours before you deliver.  You passed GBS during a previous pregnancy.  You have a urinary tract infection caused by GBS any time during pregnancy.  You have a fever during labor. SYMPTOMS Most women who have GBS do not have any symptoms. If you have a urinary tract infection caused by GBS, you might have frequent or painful urination and fever. Babies who get GBS usually show symptoms within 7 days of birth. Symptoms may include:   Breathing problems.  Heart and blood pressure problems.  Digestive and kidney problems. DIAGNOSIS Routine screening for GBS is recommended for all pregnant women. A health care provider takes a sample of the fluid in your vagina and rectum with a swab. It is then sent to a lab to be checked for GBS. A sample of your urine may also be checked for the bacteria.  TREATMENT If you test positive for GBS, you may need treatment with an antibiotic medicine during labor. As soon as you go into labor, or as soon as  your membranes rupture, you will get the antibiotic medicine through an IV access. You will continue to get the medicine until after you give birth. You do not need antibiotic medicine if you are having a cesarean delivery.If your baby shows signs or symptoms of GBS after birth, your baby can also be treated with an antibiotic medicine. HOME CARE INSTRUCTIONS   Take all antibiotic medicine as prescribed by your health care provider. Only take medicine as directed.   Continue with prenatal visits and care.   Keep all follow-up appointments.  SEEK MEDICAL CARE IF:   You have pain when you urinate.   You have to urinate frequently.   You have a fever.  SEEK IMMEDIATE MEDICAL CARE IF:   Your membranes rupture.  You go into labor.   This information is not intended to replace advice given to you by your health care provider. Make sure you discuss any questions you have with your health care provider.   Document Released: 03/28/2007 Document Revised: 12/24/2012 Document Reviewed: 10/11/2012 Elsevier Interactive Patient Education 2016 Elsevier Inc.  

## 2014-12-30 NOTE — Progress Notes (Signed)
Pt requests refill of Reglan and Flexeril

## 2014-12-30 NOTE — Addendum Note (Signed)
Addended by: Jill SideAY, Dempsy Damiano L on: 12/30/2014 11:48 AM   Modules accepted: Orders

## 2014-12-30 NOTE — Progress Notes (Signed)
Subjective:  Amanda Mahoney is a 24 y.o. [phone] at 2719w4d being seen today for ongoing prenatal care.  She is currently monitored for the following issues for this low-risk pregnancy and has Late prenatal care affecting pregnancy in second trimester, antepartum; Barbiturate misuse; Chronic cholecystitis; Anemia of mother in pregnancy, antepartum; and Hx laparoscopic cholecystectomy on her problem list.  Patient reports no complaints.  Contractions: Irregular. Vag. Bleeding: None.  Movement: Present. Denies leaking of fluid.   The following portions of the patient's history were reviewed and updated as appropriate: allergies, current medications, past family history, past medical history, past social history, past surgical history and problem list. Problem list updated.  Objective:   Filed Vitals:   12/30/14 0949  BP: 105/78  Pulse: 90  Temp: 97.8 F (36.6 C)  Weight: 247 lb 6.4 oz (112.22 kg)    Fetal Status: Fetal Heart Rate (bpm): 137   Movement: Present     General:  Alert, oriented and cooperative. Patient is in no acute distress.  Skin: Skin is warm and dry. No rash noted.   Cardiovascular: Normal heart rate noted  Respiratory: Normal respiratory effort, no problems with respiration noted  Abdomen: Soft, gravid, appropriate for gestational age. Pain/Pressure: Present     Pelvic: Vag. Bleeding: None     Cervical exam deferred        Extremities: Normal range of motion.  Edema: Trace  Mental Status: Normal mood and affect. Normal behavior. Normal judgment and thought content.   Urinalysis: Urine Protein: Negative Urine Glucose: Negative  Assessment and Plan:  Pregnancy: G4P1021 at 7119w4d  1. Barbiturate misuse   2. Late prenatal care affecting pregnancy in second trimester, antepartum   Term labor symptoms and general obstetric precautions including but not limited to vaginal bleeding, contractions, leaking of fluid and fetal movement were reviewed in detail with the  patient. Please refer to After Visit Summary for other counseling recommendations.  Return in about 1 week (around 01/06/2015).   Rhea PinkLori A Makailey Mahoney, CNM

## 2015-01-03 NOTE — L&D Delivery Note (Cosign Needed)
Delivery Note In to check on pt as fhr not tracing- RN at bedside readjusting u/s- pt feeling a lot of pressure w/ uc's- SVE 10/100/0 BBOW- arom'd w/ large amt clear fluid- fhr still not tracing so FSE placed w/o difficulty, pt began pushing and delivered w/in 2 uc's w/o difficulty.  At 2:28 PM a viable female was delivered via  (Presentation: ROA ) w/ loose nuchal cord- delivered through-reduced immediately after birth. Infant placed directly on mom's abdomenfor bonding/skin-to-skin, dried/stimulated w/ good cry. Delayed cord clamping, then cord clamped x 2, and cut by me (pt declined and no support person in room w/ her).   APGAR: refer to delivery summary; weight: pending at time of note  .  Placenta status: delivered spontaneously intact w/ maternal pushing efforts.  Cord: 3VC with the following complications: foul odor to placenta- send to path .  Cord pH: not done  Anesthesia:  Iv fentanyl Episiotomy:  n/a Lacerations:  Bilateral 1st degree hemostatic periurethral, hemostatic 1st degree perineal- none repaired Suture Repair: n/a Est. Blood Loss (mL):  350ml  Mom to postpartum.  Baby to Couplet care / Skin to Skin. Plans to bottlefeed, abstinence/condoms for contraception  All bp's have been normal, Denies ha, scotomata, ruq/epigastric pain, n/v, pre-e labs- P:C ratio 0.31, AST 52, otherwise normal UDS was neg for all SW consult d/t living at [facility], doesn't have custody of other child  Marge DuncansBooker, Slaton Reaser Randall 01/09/2015, 2:48 PM

## 2015-01-06 ENCOUNTER — Ambulatory Visit (INDEPENDENT_AMBULATORY_CARE_PROVIDER_SITE_OTHER): Payer: Medicaid Other | Admitting: Family

## 2015-01-06 VITALS — BP 135/92 | HR 108 | Temp 98.0°F | Wt 243.7 lb

## 2015-01-06 DIAGNOSIS — D649 Anemia, unspecified: Secondary | ICD-10-CM

## 2015-01-06 DIAGNOSIS — O133 Gestational [pregnancy-induced] hypertension without significant proteinuria, third trimester: Secondary | ICD-10-CM

## 2015-01-06 DIAGNOSIS — O169 Unspecified maternal hypertension, unspecified trimester: Secondary | ICD-10-CM

## 2015-01-06 DIAGNOSIS — O99013 Anemia complicating pregnancy, third trimester: Secondary | ICD-10-CM | POA: Diagnosis not present

## 2015-01-06 DIAGNOSIS — O368130 Decreased fetal movements, third trimester, not applicable or unspecified: Secondary | ICD-10-CM | POA: Diagnosis not present

## 2015-01-06 DIAGNOSIS — O0933 Supervision of pregnancy with insufficient antenatal care, third trimester: Secondary | ICD-10-CM | POA: Diagnosis not present

## 2015-01-06 DIAGNOSIS — O0932 Supervision of pregnancy with insufficient antenatal care, second trimester: Secondary | ICD-10-CM

## 2015-01-06 DIAGNOSIS — O368131 Decreased fetal movements, third trimester, fetus 1: Secondary | ICD-10-CM

## 2015-01-06 LAB — COMPREHENSIVE METABOLIC PANEL
ALBUMIN: 3.1 g/dL — AB (ref 3.6–5.1)
ALT: 35 U/L — ABNORMAL HIGH (ref 6–29)
AST: 49 U/L — ABNORMAL HIGH (ref 10–30)
Alkaline Phosphatase: 186 U/L — ABNORMAL HIGH (ref 33–115)
BILIRUBIN TOTAL: 0.4 mg/dL (ref 0.2–1.2)
BUN: 9 mg/dL (ref 7–25)
CALCIUM: 9.2 mg/dL (ref 8.6–10.2)
CO2: 22 mmol/L (ref 20–31)
CREATININE: 0.65 mg/dL (ref 0.50–1.10)
Chloride: 104 mmol/L (ref 98–110)
Glucose, Bld: 94 mg/dL (ref 65–99)
Potassium: 4.5 mmol/L (ref 3.5–5.3)
SODIUM: 137 mmol/L (ref 135–146)
TOTAL PROTEIN: 6.3 g/dL (ref 6.1–8.1)

## 2015-01-06 LAB — POCT URINALYSIS DIP (DEVICE)
Bilirubin Urine: NEGATIVE
GLUCOSE, UA: NEGATIVE mg/dL
Ketones, ur: NEGATIVE mg/dL
NITRITE: NEGATIVE
PROTEIN: NEGATIVE mg/dL
Specific Gravity, Urine: 1.01 (ref 1.005–1.030)
UROBILINOGEN UA: 1 mg/dL (ref 0.0–1.0)
pH: 6 (ref 5.0–8.0)

## 2015-01-06 LAB — CBC
HCT: 37.5 % (ref 36.0–46.0)
Hemoglobin: 12.1 g/dL (ref 12.0–15.0)
MCH: 25.3 pg — AB (ref 26.0–34.0)
MCHC: 32.3 g/dL (ref 30.0–36.0)
MCV: 78.3 fL (ref 78.0–100.0)
MPV: 10.9 fL (ref 8.6–12.4)
PLATELETS: 377 10*3/uL (ref 150–400)
RBC: 4.79 MIL/uL (ref 3.87–5.11)
RDW: 17 % — AB (ref 11.5–15.5)
WBC: 10.6 10*3/uL — ABNORMAL HIGH (ref 4.0–10.5)

## 2015-01-06 MED ORDER — FERROUS SULFATE 325 (65 FE) MG PO TABS: 325.0000 mg | ORAL_TABLET | Freq: Two times a day (BID) | ORAL | Status: AC

## 2015-01-06 NOTE — Progress Notes (Signed)
Subjective:  Amanda Mahoney is a 25 y.o. [phone] at 8264w4d being seen today for ongoing prenatal care.  She is currently monitored for the following issues for this low-risk pregnancy and has late prenatal care affecting pregnancy in second trimester, antepartum; barbiturate misuse; anemia of mother in pregnancy, antepartum.  Patient reports occasional contractions. Pt denies  headache, vision changes, or epigastric pain.   Contractions: Irregular. Vag. Bleeding: None.  Movement: (!) Decreased. Denies leaking of fluid.   The following portions of the patient's history were reviewed and updated as appropriate: allergies, current medications, past family history, past medical history, past social history, past surgical history and problem list. Problem list updated.  Objective:   Filed Vitals:   01/06/15 0958  BP: 135/92  Pulse: 108  Temp: 98 F (36.7 C)  Weight: 243 lb 11.2 oz (110.542 kg)    Fetal Status: Fetal Heart Rate (bpm): NST-R Fundal Height: 39 cm Movement: (!) Decreased.  Reports feeling baby move, less that usual.   Presentation: Vertex  General:  Alert, oriented and cooperative. Patient is in no acute distress.  Skin: Skin is warm and dry. No rash noted.   Cardiovascular: Normal heart rate noted  Respiratory: Normal respiratory effort, no problems with respiration noted  Abdomen: Soft, gravid, appropriate for gestational age. Pain/Pressure: Present     Pelvic: Vag. Bleeding: None     Cervical exam deferred        Extremities: Normal range of motion.  Edema: Trace  Mental Status: Normal mood and affect. Normal behavior. Normal judgment and thought content.   Urinalysis: Urine Protein: Negative Urine Glucose: Negative  Assessment and Plan:  Pregnancy: G4P1021 at 4164w4d  1. Elevated blood pressure affecting pregnancy, antepartum - CBC - Comprehensive metabolic panel - Protein / creatinine ratio, urine - Fetal nonstress test  2. Decreased fetal movement, third trimester,  fetus 1 - Fetal nonstress test > SNT reactive  3. Anemia of mother in pregnancy, antepartum, third trimester - RX for iron refilled  4.  Elevated Blood Pressure - Return for blood pressure check on Friday - Prex labs today  Term labor symptoms and general obstetric precautions including but not limited to vaginal bleeding, contractions, leaking of fluid and fetal movement were reviewed in detail with the patient. Please refer to After Visit Summary for other counseling recommendations.  Return in about 2 days (around 01/08/2015) for BP check.   Eino FarberWalidah Kennith GainN Karim, CNM

## 2015-01-06 NOTE — Progress Notes (Signed)
Pt reports decreased Fm x3 days.  She also has concerns about leg varicosities.  Pt states she was told by her pharmacy that they need Medicaid approval for the prescriptions given last week (Flexeril, Reglan).  She also needs refill of Ferrous Sulfate.

## 2015-01-07 LAB — PROTEIN / CREATININE RATIO, URINE
Creatinine, Urine: 95 mg/dL (ref 20–320)
Protein Creatinine Ratio: 200 mg/g creat — ABNORMAL HIGH (ref 21–161)
TOTAL PROTEIN, URINE: 19 mg/dL (ref 5–24)

## 2015-01-09 ENCOUNTER — Inpatient Hospital Stay (HOSPITAL_COMMUNITY)
Admission: AD | Admit: 2015-01-09 | Discharge: 2015-01-11 | DRG: 775 | Disposition: A | Discharge status: Home / Self Care | Payer: Medicaid Other | Source: Ambulatory Visit | Attending: Family Medicine | Admitting: Family Medicine

## 2015-01-09 ENCOUNTER — Encounter (HOSPITAL_COMMUNITY): Payer: Self-pay | Admitting: *Deleted

## 2015-01-09 DIAGNOSIS — Z3A39 39 weeks gestation of pregnancy: Secondary | ICD-10-CM

## 2015-01-09 DIAGNOSIS — O368130 Decreased fetal movements, third trimester, not applicable or unspecified: Principal | ICD-10-CM | POA: Diagnosis present

## 2015-01-09 DIAGNOSIS — O99824 Streptococcus B carrier state complicating childbirth: Secondary | ICD-10-CM | POA: Diagnosis not present

## 2015-01-09 DIAGNOSIS — IMO0001 Reserved for inherently not codable concepts without codable children: Secondary | ICD-10-CM

## 2015-01-09 DIAGNOSIS — O0932 Supervision of pregnancy with insufficient antenatal care, second trimester: Secondary | ICD-10-CM

## 2015-01-09 DIAGNOSIS — O99324 Drug use complicating childbirth: Secondary | ICD-10-CM

## 2015-01-09 DIAGNOSIS — F1390 Sedative, hypnotic, or anxiolytic use, unspecified, uncomplicated: Secondary | ICD-10-CM

## 2015-01-09 DIAGNOSIS — Z3483 Encounter for supervision of other normal pregnancy, third trimester: Secondary | ICD-10-CM | POA: Diagnosis present

## 2015-01-09 DIAGNOSIS — F1310 Sedative, hypnotic or anxiolytic abuse, uncomplicated: Secondary | ICD-10-CM

## 2015-01-09 DIAGNOSIS — K811 Chronic cholecystitis: Secondary | ICD-10-CM

## 2015-01-09 DIAGNOSIS — O99013 Anemia complicating pregnancy, third trimester: Secondary | ICD-10-CM

## 2015-01-09 LAB — CBC
HCT: 36 % (ref 36.0–46.0)
Hemoglobin: 11.5 g/dL — ABNORMAL LOW (ref 12.0–15.0)
MCH: 25.5 pg — AB (ref 26.0–34.0)
MCHC: 31.9 g/dL (ref 30.0–36.0)
MCV: 79.8 fL (ref 78.0–100.0)
PLATELETS: 297 10*3/uL (ref 150–400)
RBC: 4.51 MIL/uL (ref 3.87–5.11)
RDW: 16.8 % — AB (ref 11.5–15.5)
WBC: 10.2 10*3/uL (ref 4.0–10.5)

## 2015-01-09 LAB — COMPREHENSIVE METABOLIC PANEL
ALK PHOS: 170 U/L — AB (ref 38–126)
ALT: 41 U/L (ref 14–54)
ANION GAP: 10 (ref 5–15)
AST: 52 U/L — ABNORMAL HIGH (ref 15–41)
Albumin: 2.6 g/dL — ABNORMAL LOW (ref 3.5–5.0)
BILIRUBIN TOTAL: 0.7 mg/dL (ref 0.3–1.2)
BUN: 10 mg/dL (ref 6–20)
CALCIUM: 8.8 mg/dL — AB (ref 8.9–10.3)
CO2: 20 mmol/L — ABNORMAL LOW (ref 22–32)
Chloride: 107 mmol/L (ref 101–111)
Creatinine, Ser: 0.64 mg/dL (ref 0.44–1.00)
GFR calc Af Amer: >60 mL/min (ref >60)
Glucose, Bld: 86 mg/dL (ref 65–99)
POTASSIUM: 4.2 mmol/L (ref 3.5–5.1)
Sodium: 137 mmol/L (ref 135–145)
TOTAL PROTEIN: 6.1 g/dL — AB (ref 6.5–8.1)

## 2015-01-09 LAB — PROTEIN / CREATININE RATIO, URINE
CREATININE, URINE: 111 mg/dL
PROTEIN CREATININE RATIO: 0.31 mg/mg{creat} — AB (ref 0.00–0.15)
Total Protein, Urine: 34 mg/dL

## 2015-01-09 LAB — RAPID URINE DRUG SCREEN, HOSP PERFORMED
AMPHETAMINES: NOT DETECTED
BENZODIAZEPINES: NOT DETECTED
Barbiturates: NOT DETECTED
Cocaine: NOT DETECTED
Opiates: NOT DETECTED
TETRAHYDROCANNABINOL: NOT DETECTED

## 2015-01-09 LAB — TYPE AND SCREEN
ABO/RH(D): O POS
Antibody Screen: NEGATIVE

## 2015-01-09 LAB — ABO/RH: ABO/RH(D): O POS

## 2015-01-09 MED ORDER — IBUPROFEN 600 MG PO TABS
600.0000 mg | ORAL_TABLET | Freq: Four times a day (QID) | ORAL | Status: DC
  Administered 2015-01-09 – 2015-01-11 (×7): 600 mg via ORAL
  Filled 2015-01-09 (×8): qty 1

## 2015-01-09 MED ORDER — OXYCODONE-ACETAMINOPHEN 5-325 MG PO TABS
1.0000 | ORAL_TABLET | ORAL | Status: DC | PRN
Start: 2015-01-09 — End: 2015-01-09

## 2015-01-09 MED ORDER — ONDANSETRON HCL 4 MG/2ML IJ SOLN: 4.0000 mg | Freq: Four times a day (QID) | INTRAMUSCULAR | Status: DC | PRN

## 2015-01-09 MED ORDER — FLEET ENEMA 7-19 GM/118ML RE ENEM: 1.0000 | ENEMA | Freq: Every day | RECTAL | Status: DC | PRN

## 2015-01-09 MED ORDER — FLEET ENEMA 7-19 GM/118ML RE ENEM: 1.0000 | ENEMA | RECTAL | Status: DC | PRN

## 2015-01-09 MED ORDER — PHENYLEPHRINE 40 MCG/ML (10ML) SYRINGE FOR IV PUSH (FOR BLOOD PRESSURE SUPPORT)
80.0000 ug | PREFILLED_SYRINGE | INTRAVENOUS | Status: DC | PRN
  Filled 2015-01-09: qty 2

## 2015-01-09 MED ORDER — DIPHENHYDRAMINE HCL 25 MG PO CAPS: 25.0000 mg | ORAL_CAPSULE | Freq: Four times a day (QID) | ORAL | Status: DC | PRN

## 2015-01-09 MED ORDER — EPHEDRINE 5 MG/ML INJ
10.0000 mg | INTRAVENOUS | Status: DC | PRN
  Filled 2015-01-09: qty 2

## 2015-01-09 MED ORDER — SIMETHICONE 80 MG PO CHEW: 80.0000 mg | CHEWABLE_TABLET | ORAL | Status: DC | PRN

## 2015-01-09 MED ORDER — SENNOSIDES-DOCUSATE SODIUM 8.6-50 MG PO TABS
2.0000 | ORAL_TABLET | ORAL | Status: DC
  Administered 2015-01-10 (×2): 2 via ORAL
  Filled 2015-01-09 (×2): qty 2

## 2015-01-09 MED ORDER — FENTANYL 2.5 MCG/ML BUPIVACAINE 1/10 % EPIDURAL INFUSION (WH - ANES): 14.0000 mL/h | INTRAMUSCULAR | Status: DC | PRN

## 2015-01-09 MED ORDER — DIPHENHYDRAMINE HCL 50 MG/ML IJ SOLN: 12.5000 mg | INTRAMUSCULAR | Status: DC | PRN

## 2015-01-09 MED ORDER — OXYTOCIN BOLUS FROM INFUSION
500.0000 mL | INTRAVENOUS | Status: DC
  Administered 2015-01-09: 500 mL via INTRAVENOUS

## 2015-01-09 MED ORDER — ZOLPIDEM TARTRATE 5 MG PO TABS: 5.0000 mg | ORAL_TABLET | Freq: Every evening | ORAL | Status: DC | PRN

## 2015-01-09 MED ORDER — HYDROXYZINE HCL 50 MG PO TABS
50.0000 mg | ORAL_TABLET | Freq: Four times a day (QID) | ORAL | Status: DC | PRN
  Filled 2015-01-09: qty 1

## 2015-01-09 MED ORDER — CITRIC ACID-SODIUM CITRATE 334-500 MG/5ML PO SOLN: 30.0000 mL | ORAL | Status: DC | PRN

## 2015-01-09 MED ORDER — PRENATAL MULTIVITAMIN CH
1.0000 | ORAL_TABLET | Freq: Every day | ORAL | Status: DC
  Administered 2015-01-10: 1 via ORAL
  Filled 2015-01-09 (×2): qty 1

## 2015-01-09 MED ORDER — MEASLES, MUMPS & RUBELLA VAC ~~LOC~~ INJ
0.5000 mL | INJECTION | Freq: Once | SUBCUTANEOUS | Status: DC
  Filled 2015-01-09: qty 0.5

## 2015-01-09 MED ORDER — LACTATED RINGERS IV SOLN
INTRAVENOUS | Status: DC
  Administered 2015-01-09: 12:00:00 via INTRAVENOUS

## 2015-01-09 MED ORDER — LIDOCAINE HCL (PF) 1 % IJ SOLN
30.0000 mL | INTRAMUSCULAR | Status: DC | PRN
Start: 2015-01-09 — End: 2015-01-09
  Filled 2015-01-09: qty 30

## 2015-01-09 MED ORDER — ONDANSETRON HCL 4 MG PO TABS
4.0000 mg | ORAL_TABLET | ORAL | Status: DC | PRN
Start: 2015-01-09 — End: 2015-01-11
  Administered 2015-01-11: 4 mg via ORAL
  Filled 2015-01-09: qty 1

## 2015-01-09 MED ORDER — TETANUS-DIPHTH-ACELL PERTUSSIS 5-2.5-18.5 LF-MCG/0.5 IM SUSP: 0.5000 mL | Freq: Once | INTRAMUSCULAR | Status: DC

## 2015-01-09 MED ORDER — BISACODYL 10 MG RE SUPP: 10.0000 mg | Freq: Every day | RECTAL | Status: DC | PRN

## 2015-01-09 MED ORDER — BENZOCAINE-MENTHOL 20-0.5 % EX AERO
1.0000 "application " | INHALATION_SPRAY | CUTANEOUS | Status: DC | PRN
  Administered 2015-01-09: 1 via TOPICAL
  Filled 2015-01-09 (×2): qty 56

## 2015-01-09 MED ORDER — AMPICILLIN SODIUM 2 G IJ SOLR
2.0000 g | Freq: Once | INTRAMUSCULAR | Status: AC
Start: 2015-01-09 — End: 2015-01-09
  Administered 2015-01-09: 2 g via INTRAVENOUS
  Filled 2015-01-09: qty 2000

## 2015-01-09 MED ORDER — DIBUCAINE 1 % RE OINT
1.0000 | TOPICAL_OINTMENT | RECTAL | Status: DC | PRN
Start: 2015-01-09 — End: 2015-01-11

## 2015-01-09 MED ORDER — OXYTOCIN 10 UNIT/ML IJ SOLN
2.5000 [IU]/h | INTRAMUSCULAR | Status: DC
  Administered 2015-01-09: 2.5 [IU]/h via INTRAVENOUS

## 2015-01-09 MED ORDER — WITCH HAZEL-GLYCERIN EX PADS: 1.0000 "application " | MEDICATED_PAD | CUTANEOUS | Status: DC | PRN

## 2015-01-09 MED ORDER — OXYCODONE-ACETAMINOPHEN 5-325 MG PO TABS: 2.0000 | ORAL_TABLET | ORAL | Status: DC | PRN

## 2015-01-09 MED ORDER — ONDANSETRON HCL 4 MG/2ML IJ SOLN: 4.0000 mg | INTRAMUSCULAR | Status: DC | PRN

## 2015-01-09 MED ORDER — SODIUM CHLORIDE 0.9 % IJ SOLN: 3.0000 mL | INTRAMUSCULAR | Status: DC | PRN

## 2015-01-09 MED ORDER — ACETAMINOPHEN 325 MG PO TABS: 650.0000 mg | ORAL_TABLET | ORAL | Status: DC | PRN

## 2015-01-09 MED ORDER — ACETAMINOPHEN 325 MG PO TABS
650.0000 mg | ORAL_TABLET | ORAL | Status: DC | PRN
  Administered 2015-01-09 – 2015-01-11 (×4): 650 mg via ORAL
  Filled 2015-01-09 (×4): qty 2

## 2015-01-09 MED ORDER — LACTATED RINGERS IV SOLN: 500.0000 mL | INTRAVENOUS | Status: DC | PRN

## 2015-01-09 MED ORDER — LANOLIN HYDROUS EX OINT: TOPICAL_OINTMENT | CUTANEOUS | Status: DC | PRN

## 2015-01-09 MED ORDER — SODIUM CHLORIDE 0.9 % IV SOLN: 250.0000 mL | INTRAVENOUS | Status: DC | PRN

## 2015-01-09 MED ORDER — SODIUM CHLORIDE 0.9 % IJ SOLN: 3.0000 mL | Freq: Two times a day (BID) | INTRAMUSCULAR | Status: DC

## 2015-01-09 MED ORDER — FENTANYL CITRATE (PF) 100 MCG/2ML IJ SOLN
100.0000 ug | INTRAMUSCULAR | Status: DC | PRN
Start: 2015-01-09 — End: 2015-01-09
  Administered 2015-01-09 (×3): 100 ug via INTRAVENOUS
  Filled 2015-01-09 (×3): qty 2

## 2015-01-09 NOTE — MAU Note (Signed)
Pt states she had two contractions overnight and woke up around 0900 and started feeling contractions.  Pt states she hasn't been feeling the baby move as much.

## 2015-01-09 NOTE — MAU Note (Signed)
Urine sent to Lab

## 2015-01-09 NOTE — H&P (Signed)
Lajuan Godbee is a 25 y.o. [phone] female at [redacted]w[redacted]d by 12wk u/s, presenting in active labor.   Reports decreased fetal movement, contractions: regular, vaginal bleeding: none, membranes: intact. Initiated prenatal care at [facility] at 17 wks.   Most recent u/s efw 57% at 34wks done for s>d.   This pregnancy complicated by: Late care @ 17wks, Early glucola normal, repeat 160 w/ normal 3hr, UDS + for barbiturate use (phenobarbital), chronic cholecystits w/ cholecystectomy 09/2014, anemia- on Fe Had elevated bp at visit on 1/4, pre-e labs obtained- P:C ratio 200, AST/ALT 49/35, Cr 0.65, Hgb 12.1, Plt 377  Prenatal History/Complications:  Term SVB x 1, SAB x2  Past Medical History: Past Medical History  Diagnosis Date  . 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 GERD (gastroesophageal reflux disease)   . Depression     11/2 YR AGO HOSPITALIZED FOR DEPRESSION IN [place]  . Currently pregnant     [redacted] WEEKS    Past Surgical History: Past Surgical History  Procedure Laterality Date  . Cholecystectomy N/A 09/15/2014    Procedure: LAPAROSCOPIC CHOLECYSTECTOMY ;  Surgeon: De Blanch Kinsinger, MD;  Location: [facility]S;  Service: General;  Laterality: N/A;    Obstetrical History: OB History    Gravida Para Term Preterm AB TAB SAB Ectopic Multiple Living   4 1 1  0 2 0 2 0 0 1      Social History: Social History   Social History  . Marital Status: Single    Spouse Name: N/A  . Number of Children: N/A  . Years of Education: N/A   Social History Main Topics  . Smoking status: Never Smoker   . Smokeless tobacco: Never Used  . Alcohol Use: No  . Drug Use: No  . Sexual Activity: Yes    Birth Control/ Protection: None   Other Topics Concern  . None   Social History Narrative    Family History: Family History  Problem Relation Age of Onset  . Hypertension Mother   . Hypertension Sister   . Hypertension Maternal Grandmother     Allergies: Allergies  Allergen  Reactions  . Latex Rash    Prescriptions prior to admission  Medication Sig Dispense Refill Last Dose  . acetaminophen (TYLENOL) 325 MG tablet Take 650 mg by mouth every 6 (six) hours as needed for headache.   Taking  . cyclobenzaprine (FLEXERIL) 10 MG tablet Take 1 tablet (10 mg total) by mouth 2 (two) times daily as needed for muscle spasms. (Patient not taking: Reported on 01/06/2015) 20 tablet 0 Not Taking  . ferrous sulfate (FERROUSUL) 325 (65 FE) MG tablet Take 1 tablet (325 mg total) by mouth 2 (two) times daily. 60 tablet 1   . metoCLOPramide (REGLAN) 10 MG tablet Take 1 tablet (10 mg total) by mouth 3 (three) times daily with meals. (Patient not taking: Reported on 01/06/2015) 90 tablet 1 Not Taking  . pantoprazole (PROTONIX) 20 MG tablet Take 20 mg by mouth 2 (two) times daily.   Taking  . Prenatal Multivit-Min-Fe-FA (PRENATAL VITAMINS PO) Take 2 tablets by mouth daily. Taking otc prenatal gummies.   Taking   Review of Systems  Pertinent pos/neg as indicated in HPI  Blood pressure 135/88, pulse 92, temperature 98.2 F (36.8 C), temperature source Oral, resp. rate 18. General appearance: alert, cooperative and no distress Lungs: clear to auscultation bilaterally Heart: regular rate and rhythm Abdomen: gravid, soft, non-tender Extremities: Trace edema DTR's 2+  Fetal  monitoring: FHR: 130 bpm, variability: minimal ,  Accelerations: Present 10x10s,  decelerations:  Absent Uterine activity: q 2-313min  Dilation: 6 Effacement (%): 100 Station: -2 Exam by:: Laurell Josephsheryl Anderson RN Presentation: cephalic   Prenatal labs: ABO, Rh: O/Positive/-- (06/23 0000) Antibody: Negative (06/23 0000) Rubella: !Error! RPR: NON REAC (10/26 1128)  HBsAg: Negative (06/23 0000)  HIV: NONREACTIVE (10/26 1128)  GBS: Positive (12/14 0000)   1 hr Glucola: early 108, repeat 160; 3hr gtt 91, 208, 146, 86 Genetic screening:  Quad neg Anatomy US: normal   No results found for this or any previous visit  (from the past 24 hour(s)).   Assessment:  1437w0d SIUP  [id number]  Active labor  Cat 2 FHR  GBS Positive (12/14 0000)   Barbiturate use/phenobarbital  Elevated bp 1/4 w/ mildly elevated LFTs/normal p:c ratio, normal bp today  Plan:  Admit to BS  IV pain meds/epidural prn active labor  Expectant management  Ampicillin for gbs+/multip/6cm  Anticipate NSVB   Plans to breast/bottlefeed  Contraception: undecided  Circumcision: n/a  UDS on admit  Will repeat pre-e labs  Marge DuncansBooker, Kimberly Randall CNM, WHNP-BC 01/09/2015, 11:26 AM

## 2015-01-09 NOTE — MAU Note (Signed)
Notified Amanda Mahoney CNM patient presents via EMS G4P1 39 weeks, 6/100/-2 BBOW, CNM to put in admit orders.

## 2015-01-10 LAB — RPR: RPR: NONREACTIVE

## 2015-01-10 LAB — HIV ANTIBODY (ROUTINE TESTING W REFLEX): HIV SCREEN 4TH GENERATION: NONREACTIVE

## 2015-01-10 NOTE — Progress Notes (Signed)
Post Partum Day 1 Subjective: no complaints, up ad lib, voiding and tolerating PO  Objective: Blood pressure 115/63, pulse 83, temperature 98.3 F (36.8 C), temperature source Oral, resp. rate 18, height 5' (1.524 m), weight 243 lb (110.224 kg), SpO2 99 %, unknown if currently breastfeeding.  Physical Exam:  General: alert, cooperative, appears stated age and no distress Lochia: appropriate Uterine Fundus: firm Incision: n/a DVT Evaluation: Negative Homan's sign. No cords or calf tenderness.   Recent Labs  01/09/15 1140  HGB 11.5*  HCT 36.0    Assessment/Plan: Plan for discharge tomorrow   LOS: 1 day   LAWSON, MARIE DARLENE 01/10/2015, 9:03 AM

## 2015-01-10 NOTE — Clinical Social Work Maternal (Signed)
CLINICAL SOCIAL WORK MATERNAL/CHILD NOTE  Patient Details  Name: Amanda Mahoney MRN: [id number] Date of Birth: 08/16/1990  Date: 01/10/2015  Clinical Social Worker Initiating Note: Cheyna Retana, LCSWDate/ Time Initiated: 01/10/15/1035   Child's Name: Ana Mcmath   Legal Guardian: Mother (Leanne Gimbel)   Need for Interpreter: None   Date of Referral: 01/09/15   Reason for Referral: Other (Comment)   Referral Source: [facility]   Address: [facility] since end of July   Phone number:  ([phone])   Household Members: Self   Natural Supports (not living in the home):  (Have a sister that lives about 3 hours away)   Professional Supports: (Have a counselor at [facility])   Employment:Unemployed   Type of Work:     Education:  (taking classes to obtain GED)   Financial Resources:Medicaid   Other Resources: Food Stamps , WIC   Cultural/Religious Considerations Which May Impact Care: none noted  Strengths: Ability to meet basic needs , Home prepared for child    Risk Factors/Current Problems:   unstable housing  Cognitive State: Alert , Able to Concentrate    Mood/Affect: Calm , Happy    Acknowledged order for social work consult to address concerns regarding hx of positive UDS during pregnancy for phenobarbital. Mother also lives at [facility]. Met with mother who was pleasant and receptive to CSW. She is a single parent with one other dependent age 7. Informed that her 26 year old resides with his father because of her social situation. MOB states that DSS was not involved with this decision. Informed FOB of newborn is uninvolved and unsupportive. She reports hx of PP Depression which she believes was spurred on by relationship issues with the child's father. MOB states that although she is living in a shelter, she is in a better position now. She has limited family support. Mother  seemed surprised by the positive drug screen for phenobarbital. She questioned what the drug was and it's use. She denies any hx of substance abuse. She reports being prescribed oxycodone at 23 weeks pregnancy after having her gall bladder removed and taking and OTC cold medicine about 3 weeks ago. MOB states that she was careful about taking medication while pregnant. Mother was informed of the hospital's drug screen policy and reason for the testing. Umbilical cord drug screen pending. Mother informed of social work availability.  CSW Plan/Description:    She's aware of signs/symptoms of PP Depression and available resources No current barriers to discharge Will continue to monitor drug screen   Bentlee Drier J, LCSW 01/10/2015, 1:31 PM    CLINICAL SOCIAL WORK MATERNAL/CHILD NOTE  Patient Details  Name: Amanda Mahoney MRN: [id number] Date of Birth: [birth year]  Date:  01/10/2015  Clinical Social Worker Initiating Note:  Norlene Duel, LCSW Date/ Time Initiated:  01/10/15/1035     Child's Name:  Timoteo Ace   Legal Guardian:  Mother Asencion Guisinger)   Need for Interpreter:  None   Date of Referral:  01/09/15     Reason for Referral:  Other (Comment)   Referral Source:  [facility]   Address:  [facility] [place] since end of July  Phone number:   [phone])   Household Members:  Self   Natural Supports (not living in the home):   (Have a sister that lives about 3 hours away)   Professional Supports:  (Have a Social worker at [facility])   Employment: Unemployed   Type of Work:     Education:   (taking classes to obtain [employer])   Museum/gallery curator Resources:  Medicaid   Other Resources:  Physicist, medical , [place] Considerations Which May Impact Care:  none noted  Strengths:  Ability to meet basic needs , Home prepared for child    Risk Factors/Current Problems:    unstable housing  Cognitive State:  Alert , Able to Concentrate    Mood/Affect:  Calm , Happy    Acknowledged order for social work consult to address concerns regarding hx of positive UDS during pregnancy for phenobarbital.  Mother also lives at [facility] [place].  Met with mother who was pleasant and receptive to CSW.  She is a single parent with one other dependent age 28.   Informed that her 26 year old resides with his father because of her social situation.  MOB states that DSS was not involved with this decision.     Informed FOB of newborn is uninvolved and unsupportive.  She reports hx of PP Depression which she believes was spurred on by relationship issues with the child's father.  MOB states that although she is living in a shelter, she is in a better position now.  She has limited family support.  Mother seemed surprised by the  positive drug screen for phenobarbital.  She questioned what the drug was and it's use.  She denies any hx of substance abuse.  She reports being prescribed oxycodone at 23 weeks pregnancy after having her gall bladder removed and taking and OTC cold medicine about 3 weeks ago.  MOB states that she was careful about taking medication while pregnant.   Mother was informed of the hospital's drug screen policy and reason for the testing.   Umbilical cord drug screen pending.   Mother informed of social work Fish farm manager.  CSW Plan/Description:     She's aware of signs/symptoms of PP Depression and available resources No current barriers to discharge Will continue to monitor drug screen   Yoseline Andersson J, LCSW 01/10/2015, 1:31 PM

## 2015-01-11 MED ORDER — OMEPRAZOLE 20 MG PO CPDR: 20.0000 mg | DELAYED_RELEASE_CAPSULE | Freq: Every day | ORAL | Status: DC

## 2015-01-11 MED ORDER — IBUPROFEN 600 MG PO TABS: 600.0000 mg | ORAL_TABLET | Freq: Four times a day (QID) | ORAL | Status: AC | PRN

## 2015-01-11 MED ORDER — ACETAMINOPHEN 325 MG PO TABS: 650.0000 mg | ORAL_TABLET | ORAL | Status: AC | PRN

## 2015-01-11 MED ORDER — ONDANSETRON HCL 4 MG PO TABS: 4.0000 mg | ORAL_TABLET | Freq: Three times a day (TID) | ORAL | Status: DC | PRN

## 2015-01-11 MED ORDER — SENNOSIDES-DOCUSATE SODIUM 8.6-50 MG PO TABS: 1.0000 | ORAL_TABLET | Freq: Every day | ORAL | Status: DC

## 2015-01-11 NOTE — Discharge Instructions (Signed)

## 2015-01-11 NOTE — Discharge Summary (Signed)
 OB Discharge Summary     Patient Name: Amanda Mahoney DOB: 11/01/1990 MRN: [id number]  Date of admission: 01/09/2015 Delivering MD: BOOKER, KIMBERLY R   Date of discharge: 01/11/2015  Admitting diagnosis: 39 wks contractions k Intrauterine pregnancy: [redacted]w[redacted]d     Secondary diagnosis:  Active Problems: SVD  Additional problems: Late PNC at 17 wks. 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 yo G4P2022 at [redacted]w[redacted]d was admitted in Active Labor on 01/09/2015. Patient had an uncomplicated labor course as follows:  Membrane Rupture Time/Date: 2:20 PM ,01/09/2015   Intrapartum Procedures: Episiotomy: None [1]                                         Lacerations:  None [1]  Patient had a delivery of a Viable infant. 01/09/2015  Information for the patient's newborn:  Binford, Girl Stewart [[id number]]  Delivery Method: Vaginal, Spontaneous Delivery (Filed from Delivery Summary)     [name] had an uncomplicated postpartum course.  She is ambulating, tolerating a regular diet, passing flatus, and urinating well. Patient is discharged home in stable condition on 01/11/2015.  Patient noted to have some nausea and vomiting x 1 on day of discharge. All vitals were within normal limits. Exam was unremarkable. Patient was prescribed Zofran PRN.   Physical exam  Filed Vitals:   01/10/15 0644 01/10/15 1724 01/11/15 0532 01/11/15 1313  BP: 115/63 133/74 135/85 135/86  Pulse: 83 94 92 87  Temp: 98.3 F (36.8 C) 98.9  F (37.2 C) 98.1 F (36.7 C) 98 F (36.7 C)  TempSrc: Oral Oral Oral Oral  Resp: 18 18 16 16  Height:      Weight:      SpO2:   99% 98%   General: alert and cooperative Uterine Fundus: firm Incision: N/A DVT Evaluation: No evidence of DVT seen on physical exam. No cords or calf tenderness. No significant calf/ankle edema. Labs: Lab Results  Component Value Date   WBC 10.2 01/09/2015   HGB 11.5* 01/09/2015   HCT 36.0 01/09/2015   MCV 79.8 01/09/2015   PLT 297 01/09/2015   CMP Latest Ref Rng 01/09/2015  Glucose 65 - 99 mg/dL 86  BUN 6 - 20 mg/dL 10  Creatinine 0.44 - 1.00 mg/dL 0.64  Sodium 135 - 145 mmol/L 137  Potassium 3.5 - 5.1 mmol/L 4.2  Chloride 101 - 111 mmol/L 107  CO2 22 - 32 mmol/L 20(L)  Calcium 8.9 -   10.3 mg/dL 8.8(L)  Total Protein 6.5 - 8.1 g/dL 6.1(L)  Total Bilirubin 0.3 - 1.2 mg/dL 0.7  Alkaline Phos 38 - 126 U/L 170(H)  AST 15 - 41 U/L 52(H)  ALT 14 - 54 U/L 41    Discharge instruction: per After Visit Summary and "Baby and Me Booklet".  After visit meds:    Medication List    STOP taking these medications        cyclobenzaprine 10 MG tablet  Commonly known as:  FLEXERIL     metoCLOPramide 10 MG tablet  Commonly known as:  REGLAN      TAKE these medications        acetaminophen 325 MG tablet  Commonly known as:  TYLENOL  Take 2 tablets (650 mg total) by mouth every 4 (four) hours as needed (for pain scale < 4).     ferrous sulfate 325 (65 FE) MG tablet  Commonly known as:  FERROUSUL  Take 1 tablet (325 mg total) by mouth 2 (two) times daily.     flintstones complete 60 MG chewable tablet  Chew 2 tablets by mouth daily.     ibuprofen 600 MG tablet  Commonly known as:  ADVIL,MOTRIN  Take 1 tablet (600 mg total) by mouth every 6 (six) hours as needed.     ondansetron 4 MG tablet  Commonly known as:  ZOFRAN  Take 1 tablet (4 mg total) by mouth every 8 (eight) hours as needed for nausea or vomiting.     pantoprazole 20 MG tablet   Commonly known as:  PROTONIX  Take 20 mg by mouth 2 (two) times daily.     senna-docusate 8.6-50 MG tablet  Commonly known as:  Senokot-S  Take 1 tablet by mouth at bedtime.        Diet: routine diet  Activity: Advance as tolerated. Pelvic rest for 6 weeks.   Outpatient follow up:6 weeks Follow up Appt:No future appointments. Follow up Visit:No Follow-up on file.  Postpartum contraception: Condoms  Newborn Data: Live born female  Birth Weight: 7 lb 5.6 oz (3334 g) APGAR: 8, 9  Baby Feeding: Bottle and Breast Disposition:home with mother   01/11/2015 Amanda G Gunadasa, MD    

## 2015-01-12 ENCOUNTER — Encounter: Payer: Medicaid Other | Admitting: Student

## 2015-01-17 NOTE — Discharge Summary (Signed)
OB Discharge Summary     Patient Name: Amanda Mahoney DOB: 06/06/1990 MRN: [id number]  Date of admission: 01/09/2015 Delivering MD: Shawna ClampBOOKER, KIMBERLY R   Date of discharge: 01/11/2015  Admitting diagnosis: 39 wks contractions k Intrauterine pregnancy: 6943w0d     Secondary diagnosis:  Active Problems: SVD  Additional problems: Late PNC at 17 wks. 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 yo [id number] at 443w0d was admitted in Active Labor on 01/09/2015. Patient had an uncomplicated labor course as follows:  Membrane Rupture Time/Date: 2:20 PM ,01/09/2015   Intrapartum Procedures: Episiotomy: None [1]                                         Lacerations:  None [1]  Patient had a delivery of a Viable infant. 01/09/2015  Information for the patient's newborn:  Venita LickMendoza, Girl Itzayana [[id number]][[id number]]  Delivery Method: Vaginal, Spontaneous Delivery (Filed from Delivery Summary)     [name] had an uncomplicated postpartum course.  She is ambulating, tolerating a regular diet, passing flatus, and urinating well. Patient is discharged home in stable condition on 01/11/2015.  Patient noted to have some nausea and vomiting x 1 on day of discharge. All vitals were within normal limits. Exam was unremarkable. Patient was prescribed Zofran PRN.   Physical exam  Filed Vitals:   01/10/15 0644 01/10/15 1724 01/11/15 0532 01/11/15 1313  BP: 115/63 133/74 135/85 135/86  Pulse: 83 94 92 87  Temp: 98.3 F (36.8 C) 98.9  F (37.2 C) 98.1 F (36.7 C) 98 F (36.7 C)  TempSrc: Oral Oral Oral Oral  Resp: 18 18 16 16   Height:      Weight:      SpO2:   99% 98%   General: alert and cooperative Uterine Fundus: firm Incision: N/A DVT Evaluation: No evidence of DVT seen on physical exam. No cords or calf tenderness. No significant calf/ankle edema. Labs: Lab Results  Component Value Date   WBC 10.2 01/09/2015   HGB 11.5* 01/09/2015   HCT 36.0 01/09/2015   MCV 79.8 01/09/2015   PLT 297 01/09/2015   CMP Latest Ref Rng 01/09/2015  Glucose 65 - 99 mg/dL 86  BUN 6 - 20 mg/dL 10  Creatinine 6.430.44 - 3.291.00 mg/dL 5.180.64  Sodium 841135 - 660145 mmol/L 137  Potassium 3.5 - 5.1 mmol/L 4.2  Chloride 101 - 111 mmol/L 107  CO2 22 - 32 mmol/L 20(L)  Calcium 8.9 -  10.3 mg/dL 1.6(X)  Total Protein 6.5 - 8.1 g/dL 6.1(L)  Total Bilirubin 0.3 - 1.2 mg/dL 0.7  Alkaline Phos 38 - 126 U/L 170(H)  AST 15 - 41 U/L 52(H)  ALT 14 - 54 U/L 41    Discharge instruction: per After Visit Summary and "Baby and Me Booklet".  After visit meds:    Medication List    STOP taking these medications        cyclobenzaprine 10 MG tablet  Commonly known as:  FLEXERIL     metoCLOPramide 10 MG tablet  Commonly known as:  REGLAN      TAKE these medications        acetaminophen 325 MG tablet  Commonly known as:  TYLENOL  Take 2 tablets (650 mg total) by mouth every 4 (four) hours as needed (for pain scale < 4).     ferrous sulfate 325 (65 FE) MG tablet  Commonly known as:  FERROUSUL  Take 1 tablet (325 mg total) by mouth 2 (two) times daily.     flintstones complete 60 MG chewable tablet  Chew 2 tablets by mouth daily.     ibuprofen 600 MG tablet  Commonly known as:  ADVIL,MOTRIN  Take 1 tablet (600 mg total) by mouth every 6 (six) hours as needed.     ondansetron 4 MG tablet  Commonly known as:  ZOFRAN  Take 1 tablet (4 mg total) by mouth every 8 (eight) hours as needed for nausea or vomiting.     pantoprazole 20 MG tablet   Commonly known as:  PROTONIX  Take 20 mg by mouth 2 (two) times daily.     senna-docusate 8.6-50 MG tablet  Commonly known as:  Senokot-S  Take 1 tablet by mouth at bedtime.        Diet: routine diet  Activity: Advance as tolerated. Pelvic rest for 6 weeks.   Outpatient follow up:6 weeks Follow up Appt:No future appointments. Follow up Visit:No Follow-up on file.  Postpartum contraception: Condoms  Newborn Data: Live born female  Birth Weight: 7 lb 5.6 oz (3334 g) APGAR: 8, 9  Baby Feeding: Bottle and Breast Disposition:home with mother   01/11/2015 Palma Holter, MD

## 2015-03-04 ENCOUNTER — Ambulatory Visit: Payer: Medicaid Other | Admitting: Advanced Practice Midwife

## 2015-03-11 ENCOUNTER — Ambulatory Visit: Payer: Medicaid Other | Admitting: Medical

## 2015-04-07 ENCOUNTER — Other Ambulatory Visit (HOSPITAL_COMMUNITY)
Admission: RE | Admit: 2015-04-07 | Discharge: 2015-04-07 | Disposition: A | Discharge status: Home / Self Care | Payer: Medicaid Other | Source: Ambulatory Visit | Attending: Obstetrics and Gynecology | Admitting: Obstetrics and Gynecology

## 2015-04-07 ENCOUNTER — Encounter: Payer: Self-pay | Admitting: Obstetrics and Gynecology

## 2015-04-07 ENCOUNTER — Ambulatory Visit (INDEPENDENT_AMBULATORY_CARE_PROVIDER_SITE_OTHER): Payer: Medicaid Other | Admitting: Obstetrics and Gynecology

## 2015-04-07 VITALS — BP 106/53 | HR 93 | Temp 98.7°F | Ht 59.0 in | Wt 231.3 lb

## 2015-04-07 DIAGNOSIS — Z01419 Encounter for gynecological examination (general) (routine) without abnormal findings: Secondary | ICD-10-CM | POA: Insufficient documentation

## 2015-04-07 DIAGNOSIS — Z1151 Encounter for screening for human papillomavirus (HPV): Secondary | ICD-10-CM | POA: Diagnosis not present

## 2015-04-07 DIAGNOSIS — Z124 Encounter for screening for malignant neoplasm of cervix: Secondary | ICD-10-CM

## 2015-04-07 NOTE — Patient Instructions (Signed)
Contraception Choices Contraception (birth control) is the use of any methods or devices to prevent pregnancy. Below are some methods to help avoid pregnancy. HORMONAL METHODS   Contraceptive implant. This is a thin, plastic tube containing progesterone hormone. It does not contain estrogen hormone. Your health care provider inserts the tube in the inner part of the upper arm. The tube can remain in place for up to 3 years. After 3 years, the implant must be removed. The implant prevents the ovaries from releasing an egg (ovulation), thickens the cervical mucus to prevent sperm from entering the uterus, and thins the lining of the inside of the uterus.  Progesterone-only injections. These injections are given every 3 months by your health care provider to prevent pregnancy. This synthetic progesterone hormone stops the ovaries from releasing eggs. It also thickens cervical mucus and changes the uterine lining. This makes it harder for sperm to survive in the uterus.  Birth control pills. These pills contain estrogen and progesterone hormone. They work by preventing the ovaries from releasing eggs (ovulation). They also cause the cervical mucus to thicken, preventing the sperm from entering the uterus. Birth control pills are prescribed by a health care provider.Birth control pills can also be used to treat heavy periods.  Minipill. This type of birth control pill contains only the progesterone hormone. They are taken every day of each month and must be prescribed by your health care provider.  Birth control patch. The patch contains hormones similar to those in birth control pills. It must be changed once a week and is prescribed by a health care provider.  Vaginal ring. The ring contains hormones similar to those in birth control pills. It is left in the vagina for 3 weeks, removed for 1 week, and then a new one is put back in place. The patient must be comfortable inserting and removing the ring  from the vagina.A health care provider's prescription is necessary.  Emergency contraception. Emergency contraceptives prevent pregnancy after unprotected sexual intercourse. This pill can be taken right after sex or up to 5 days after unprotected sex. It is most effective the sooner you take the pills after having sexual intercourse. Most emergency contraceptive pills are available without a prescription. Check with your pharmacist. Do not use emergency contraception as your only form of birth control. BARRIER METHODS   Female condom. This is a thin sheath (latex or rubber) that is worn over the penis during sexual intercourse. It can be used with spermicide to increase effectiveness.  Female condom. This is a soft, loose-fitting sheath that is put into the vagina before sexual intercourse.  Diaphragm. This is a soft, latex, dome-shaped barrier that must be fitted by a health care provider. It is inserted into the vagina, along with a spermicidal jelly. It is inserted before intercourse. The diaphragm should be left in the vagina for 6 to 8 hours after intercourse.  Cervical cap. This is a round, soft, latex or plastic cup that fits over the cervix and must be fitted by a health care provider. The cap can be left in place for up to 48 hours after intercourse.  Sponge. This is a soft, circular piece of polyurethane foam. The sponge has spermicide in it. It is inserted into the vagina after wetting it and before sexual intercourse.  Spermicides. These are chemicals that kill or block sperm from entering the cervix and uterus. They come in the form of creams, jellies, suppositories, foam, or tablets. They do not require a   prescription. They are inserted into the vagina with an applicator before having sexual intercourse. The process must be repeated every time you have sexual intercourse. INTRAUTERINE CONTRACEPTION  Intrauterine device (IUD). This is a T-shaped device that is put in a woman's uterus  during a menstrual period to prevent pregnancy. There are 2 types:  Copper IUD. This type of IUD is wrapped in copper wire and is placed inside the uterus. Copper makes the uterus and fallopian tubes produce a fluid that kills sperm. It can stay in place for 10 years.  Hormone IUD. This type of IUD contains the hormone progestin (synthetic progesterone). The hormone thickens the cervical mucus and prevents sperm from entering the uterus, and it also thins the uterine lining to prevent implantation of a fertilized egg. The hormone can weaken or kill the sperm that get into the uterus. It can stay in place for 3-5 years, depending on which type of IUD is used. PERMANENT METHODS OF CONTRACEPTION  Female tubal ligation. This is when the woman's fallopian tubes are surgically sealed, tied, or blocked to prevent the egg from traveling to the uterus.  Hysteroscopic sterilization. This involves placing a small coil or insert into each fallopian tube. Your doctor uses a technique called hysteroscopy to do the procedure. The device causes scar tissue to form. This results in permanent blockage of the fallopian tubes, so the sperm cannot fertilize the egg. It takes about 3 months after the procedure for the tubes to become blocked. You must use another form of birth control for these 3 months.  Female sterilization. This is when the female has the tubes that carry sperm tied off (vasectomy).This blocks sperm from entering the vagina during sexual intercourse. After the procedure, the man can still ejaculate fluid (semen). NATURAL PLANNING METHODS  Natural family planning. This is not having sexual intercourse or using a barrier method (condom, diaphragm, cervical cap) on days the woman could become pregnant.  Calendar method. This is keeping track of the length of each menstrual cycle and identifying when you are fertile.  Ovulation method. This is avoiding sexual intercourse during ovulation.  Symptothermal  method. This is avoiding sexual intercourse during ovulation, using a thermometer and ovulation symptoms.  Post-ovulation method. This is timing sexual intercourse after you have ovulated. Regardless of which type or method of contraception you choose, it is important that you use condoms to protect against the transmission of sexually transmitted infections (STIs). Talk with your health care provider about which form of contraception is most appropriate for you.   This information is not intended to replace advice given to you by your health care provider. Make sure you discuss any questions you have with your health care provider.   Document Released: 12/19/2004 Document Revised: 12/24/2012 Document Reviewed: 06/13/2012 Elsevier Interactive Patient Education 2016 [employer]  

## 2015-04-07 NOTE — Progress Notes (Signed)
Patient ID: Amanda Mahoney, female   DOB: 04/18/1990, 25 y.o.   MRN: [id number] Subjective:     Amanda Mahoney is a 25 y.o. female who presents for a postpartum visit. She is 12 weeks postpartum following a spontaneous vaginal delivery. I have fully reviewed the prenatal and intrapartum course. The delivery was at 39 gestational weeks. Outcome: spontaneous vaginal delivery. Anesthesia: IV sedation. Postpartum course has been uncomplicated. Baby's course has been uncomplicated. Baby is feeding by bottle - Similac Advance. Bleeding normal 4-day cycle. Bowel function is normal. Bladder function is normal. Patient is not sexually active. Contraception method is none. Postpartum depression screening: negative.     Review of Systems Pertinent items are noted in HPI.   Objective:    BP 106/53 mmHg  Pulse 93  Temp(Src) 98.7 F (37.1 C)  Ht 4\' 11"  (1.499 m)  Wt 231 lb 4.8 oz (104.917 kg)  BMI 46.69 kg/m2  LMP 03/21/2015  Breastfeeding? No  General:  alert, cooperative and no distress   Breasts:  inspection negative, no nipple discharge or bleeding, no masses or nodularity palpable  Lungs: clear to auscultation bilaterally  Heart:  regular rate and rhythm  Abdomen: soft, non-tender; bowel sounds normal; no masses,  no organomegaly obeses   Vulva:  normal  Vagina: normal vagina, no discharge, exudate, lesion, or erythema  Cervix:  multiparous appearance  Corpus: normal size, contour, position, consistency, mobility, non-tender  Adnexa:  normal adnexa and no mass, fullness, tenderness  Rectal Exam: Not performed.        Assessment:     normal postpartum exam. Pap smear done at today's visit.   Plan:    1. Contraception: abstinence 2. Patient is medically cleared to resume all activities of daily living 3. Patient will be contacted with any abnormal results 3. Follow up in: 1 year or sooner for contraception or as needed.

## 2015-04-08 LAB — CYTOLOGY - PAP

## 2017-03-05 ENCOUNTER — Encounter: Payer: Self-pay | Admitting: *Deleted
